# Patient Record
Sex: Female | Born: 1985 | Race: Black or African American | Hispanic: No | Marital: Single | Smoking: Never smoker
Health system: Southern US, Community
[De-identification: ages and names within clinical notes are randomized; demographics above are authoritative.]

## PROBLEM LIST (undated history)

## (undated) DIAGNOSIS — F419 Anxiety disorder, unspecified: Secondary | ICD-10-CM

## (undated) DIAGNOSIS — K8020 Calculus of gallbladder without cholecystitis without obstruction: Secondary | ICD-10-CM

## (undated) DIAGNOSIS — IMO0002 Reserved for concepts with insufficient information to code with codable children: Secondary | ICD-10-CM

## (undated) DIAGNOSIS — R0600 Dyspnea, unspecified: Secondary | ICD-10-CM

## (undated) DIAGNOSIS — J069 Acute upper respiratory infection, unspecified: Secondary | ICD-10-CM

## (undated) DIAGNOSIS — K219 Gastro-esophageal reflux disease without esophagitis: Secondary | ICD-10-CM

## (undated) HISTORY — DX: Reserved for concepts with insufficient information to code with codable children: IMO0002

## (undated) HISTORY — DX: Gastro-esophageal reflux disease without esophagitis: K21.9

## (undated) HISTORY — DX: Calculus of gallbladder without cholecystitis without obstruction: K80.20

## (undated) HISTORY — PX: CHOLECYSTECTOMY: SHX55

---
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
## 2004-02-12 ENCOUNTER — Other Ambulatory Visit: Admission: RE | Admit: 2004-02-12 | Discharge: 2004-02-12 | Payer: Self-pay | Admitting: Family Medicine

## 2005-09-20 ENCOUNTER — Emergency Department (HOSPITAL_COMMUNITY): Admission: EM | Admit: 2005-09-20 | Discharge: 2005-09-20 | Payer: Self-pay | Admitting: Emergency Medicine

## 2006-02-16 ENCOUNTER — Inpatient Hospital Stay (HOSPITAL_COMMUNITY): Admission: AD | Admit: 2006-02-16 | Discharge: 2006-02-16 | Payer: Self-pay | Admitting: *Deleted

## 2006-03-06 ENCOUNTER — Inpatient Hospital Stay (HOSPITAL_COMMUNITY): Admission: AD | Admit: 2006-03-06 | Discharge: 2006-03-06 | Payer: Self-pay | Admitting: Family Medicine

## 2006-06-17 ENCOUNTER — Inpatient Hospital Stay (HOSPITAL_COMMUNITY): Admission: AD | Admit: 2006-06-17 | Discharge: 2006-06-17 | Payer: Self-pay | Admitting: Family Medicine

## 2006-08-11 ENCOUNTER — Ambulatory Visit: Payer: Self-pay | Admitting: Obstetrics & Gynecology

## 2006-08-11 ENCOUNTER — Encounter (INDEPENDENT_AMBULATORY_CARE_PROVIDER_SITE_OTHER): Payer: Self-pay | Admitting: Obstetrics & Gynecology

## 2006-12-21 ENCOUNTER — Inpatient Hospital Stay (HOSPITAL_COMMUNITY): Admission: AD | Admit: 2006-12-21 | Discharge: 2006-12-21 | Payer: Self-pay | Admitting: Obstetrics and Gynecology

## 2007-06-02 ENCOUNTER — Inpatient Hospital Stay (HOSPITAL_COMMUNITY): Admission: AD | Admit: 2007-06-02 | Discharge: 2007-06-03 | Payer: Self-pay | Admitting: Gynecology

## 2007-09-29 ENCOUNTER — Inpatient Hospital Stay (HOSPITAL_COMMUNITY): Admission: AD | Admit: 2007-09-29 | Discharge: 2007-09-30 | Payer: Self-pay | Admitting: Obstetrics & Gynecology

## 2008-01-19 ENCOUNTER — Inpatient Hospital Stay (HOSPITAL_COMMUNITY): Admission: AD | Admit: 2008-01-19 | Discharge: 2008-01-21 | Payer: Self-pay | Admitting: Obstetrics and Gynecology

## 2008-10-26 HISTORY — PX: LAPAROSCOPIC CHOLECYSTECTOMY: SUR755

## 2010-10-14 ENCOUNTER — Emergency Department (HOSPITAL_COMMUNITY)
Admission: EM | Admit: 2010-10-14 | Discharge: 2010-10-14 | Payer: Self-pay | Source: Home / Self Care | Admitting: Emergency Medicine

## 2011-01-05 LAB — POCT I-STAT, CHEM 8
BUN: 7 mg/dL (ref 6–23)
Calcium, Ion: 1.02 mmol/L — ABNORMAL LOW (ref 1.12–1.32)
Chloride: 106 mEq/L (ref 96–112)
Creatinine, Ser: 1 mg/dL (ref 0.4–1.2)
Glucose, Bld: 112 mg/dL — ABNORMAL HIGH (ref 70–99)
HCT: 40 % (ref 36.0–46.0)
Hemoglobin: 13.6 g/dL (ref 12.0–15.0)
Potassium: 4.5 mEq/L (ref 3.5–5.1)
Sodium: 140 mEq/L (ref 135–145)
TCO2: 28 mmol/L (ref 0–100)

## 2011-01-05 LAB — URINALYSIS, ROUTINE W REFLEX MICROSCOPIC
Bilirubin Urine: NEGATIVE
Glucose, UA: NEGATIVE mg/dL
Hgb urine dipstick: NEGATIVE
Ketones, ur: NEGATIVE mg/dL
Nitrite: NEGATIVE
Protein, ur: NEGATIVE mg/dL
Specific Gravity, Urine: 1.018 (ref 1.005–1.030)
Urobilinogen, UA: 1 mg/dL (ref 0.0–1.0)
pH: 7.5 (ref 5.0–8.0)

## 2011-01-05 LAB — DIFFERENTIAL
Basophils Absolute: 0 10*3/uL (ref 0.0–0.1)
Basophils Relative: 0 % (ref 0–1)
Eosinophils Absolute: 0.1 10*3/uL (ref 0.0–0.7)
Eosinophils Relative: 1 % (ref 0–5)
Lymphocytes Relative: 11 % — ABNORMAL LOW (ref 12–46)
Lymphs Abs: 1.3 10*3/uL (ref 0.7–4.0)
Monocytes Absolute: 0.9 10*3/uL (ref 0.1–1.0)
Monocytes Relative: 7 % (ref 3–12)
Neutro Abs: 10.2 10*3/uL — ABNORMAL HIGH (ref 1.7–7.7)
Neutrophils Relative %: 82 % — ABNORMAL HIGH (ref 43–77)

## 2011-01-05 LAB — POCT PREGNANCY, URINE: Preg Test, Ur: NEGATIVE

## 2011-01-05 LAB — CBC
HCT: 37.7 % (ref 36.0–46.0)
Hemoglobin: 13.4 g/dL (ref 12.0–15.0)
MCH: 31.2 pg (ref 26.0–34.0)
MCHC: 35.5 g/dL (ref 30.0–36.0)
MCV: 87.9 fL (ref 78.0–100.0)
Platelets: 299 10*3/uL (ref 150–400)
RBC: 4.29 MIL/uL (ref 3.87–5.11)
RDW: 12 % (ref 11.5–15.5)
WBC: 12.4 10*3/uL — ABNORMAL HIGH (ref 4.0–10.5)

## 2011-01-05 LAB — URINE MICROSCOPIC-ADD ON

## 2011-01-05 LAB — HEMOCCULT GUIAC POC 1CARD (OFFICE): Fecal Occult Bld: NEGATIVE

## 2011-01-05 LAB — OCCULT BLOOD, POC DEVICE: Fecal Occult Bld: NEGATIVE

## 2011-03-10 NOTE — Discharge Summary (Signed)
NAMEMarland Cruz  DARRA, [name]:  [id number]   MEDICAL RECORD NO.:  [id number]          PATIENT TYPE:  INP   LOCATION:  9142                          [facility]ILITY:  [facility]   PHYSICIAN:  [name], M.D. DATE OF BIRTH:  1986-09-30   DATE OF ADMISSION:  01/19/2008  DATE OF DISCHARGE:  01/21/2008                               DISCHARGE SUMMARY   HISTORY OF PRESENT ILLNESS:  A 25 year old black female para 0-0-1-0,  gravida 2 at [redacted] weeks gestation with an [facility] of January 25, 2008, by 6-week  ultrasound, presented to the office with spontaneous rupture of  membranes at approximately 9 a.m.  Clear fluid was noted and confirmed  at the office.  Prenatal care was started when the patient was 32 weeks'  gestation, complicated by ongoing nausea and vomiting related to  cholelithiasis at times.   PRENATAL CARE AND LABS:  O positive, negative antibody, sickle cell AS,  rubella immune, hepatitis B surface antigen negative, HIV negative, GC  and chlamydia negative, Group B Strep negative, and 1-hour Glucola 82.  The patient was too late for genetic screens.   PAST OBSTETRIC HISTORY:  Early abortion x1.  Negative GYN and surgical  history.  The patient does have sickle cell trait.   ALLERGIES:  She has no allergies.   MEDICATIONS:  Include Phenergan, hydrocodone, and Prilosec.   PHYSICAL EXAMINATION:  She is afebrile.  Vitals signs were normal.  Heart and lungs were normal.  Abdomen was soft, contraction to every 3-5  minutes.  Cervix was 1-2 cm, 50%.  Vertex was confirmed by sonogram.   HOSPITAL COURSE:  The patient was placed on Pitocin.  By 6 p.m, she was  comfortable with an epidural and the cervix was 5 cm, 80%, vertex at -1  to 0.  Intrauterine pressure catheter was placed to adjust the Pitocin.  The patient reached complete dilatation and pushed well for  approximately 1 hour with a spontaneous vaginal delivery of a vigorous  female infant by Dr. Senaida Ores over an intact  perineum.  Weight was 7  pounds 1 ounce.  Apgars 8 and 9 at 1 and 5 minutes.  Nuchal cord x1 was  reduced over the head.  Placenta delivered spontaneously.  Cervix,  rectum, and vagina were intact.  Blood loss about 350 mL.  Postpartum,  the patient did well and was discharged on the second postpartum day.  She was seen in consultation by social services because of late prenatal  care.  No further intervention was required.   LABORATORY DATA:  Showed an initial hemoglobin of 11.2, hematocrit 32.6,  white count of 11,200, and platelet count of 270,000.  RPR was  nonreactive.  Followup hemoglobin 7.3.   FINAL DIAGNOSES:  1. Intrauterine pregnancy at 39 plus weeks, delivered vertex.  2. Cholelithiasis.   OPERATION:  Spontaneous delivery vertex.   CONDITION ON DISCHARGE:  Improved.   DISCHARGE INSTRUCTIONS:  Instructions include our regular discharge  instruction booklet.  The patient has hydrocodone at home.  She was  given a prescription for Motrin 600 mg 30 tables 1 q.6 h. as  needed for  pain with 1 refill.  She is to return to the office in 6 weeks for  followup examination.   The patient is to be instructed at 6 weeks about a surgical  consultation.      [name], M.D.  Electronically Signed     TFH/MEDQ  D:  01/21/2008  T:  01/21/2008  Job:  [id number]

## 2011-03-13 NOTE — Group Therapy Note (Signed)
NAME:  Sabrina Cruz, Sabrina Cruz NO.:  [id number]   MEDICAL RECORD NO.:  [id number]          PATIENT TYPE:  WOC   LOCATION:  [facility]                   FACILITY:  [facility]   PHYSICIAN:  Dorthula Perfect, MD     DATE OF BIRTH:  1986-07-11   DATE OF SERVICE:                                    CLINIC NOTE   A 25 year old black female gravida 1, abort 1, last menstrual period July 29, 2006 was seen in the MAU for abnormal uterine bleeding, clotting, etc. on  June 17, 2006.  Transvaginal ultrasound was done at that time.  The right  ovary measures 6.5 x 5.5 x 6.8.  It contained a simple cyst that measured  4.8 x 5.6 x 6.2.  Evaluation in the MAU was pretty much normal.  She was  treated with Femcon FE.   She currently takes Femcon FE and now has cyclic periods and no abnormal  bleeding.  In addition, when she was seen in the MAU, she was having some  right lower quadrant discomfort which has now gone away.  Her last Pap smear  was a year ago and by her description was normal.   REVIEW OF SYSTEMS:  Normal good health.  No GI or GU complaints.   PHYSICAL EXAMINATION:  Weight 180, 5 foot 6, blood pressure 115/71.  The  abdomen is slightly obese.  It is soft and nontender.  PELVIC EXAMINATION:  External genitalia, BUS and glands were normal.  Vaginal vault epithelialized.  The cervix was nulliparous and normal.  Uterus is in the midline and is of normal size and shape.  Both adnexal  areas are normal.  I cannot palpate an ovarian cyst on the right side today.   IMPRESSION:  Ovarian cyst resolved.   DISPOSITION:  1. Pap smear.  2. The patient with will continue with oral contraception, Femcon FE.  3. She is advised that if her Pap smear is normal it needs to be repeated      in a year.           ______________________________  Dorthula Perfect, MD     ER/MEDQ  D:  08/11/2006  T:  08/12/2006  Job:  [id number]

## 2011-07-20 LAB — CBC
HCT: 30.4 — ABNORMAL LOW
HCT: 32.6 — ABNORMAL LOW
Hemoglobin: 10.3 — ABNORMAL LOW
Hemoglobin: 11.2 — ABNORMAL LOW
MCHC: 34
MCHC: 34.4
MCV: 89.5
MCV: 90.1
Platelets: 249
Platelets: 270
RBC: 3.37 — ABNORMAL LOW
RBC: 3.65 — ABNORMAL LOW
RDW: 13.4
RDW: 13.6
WBC: 11.2 — ABNORMAL HIGH
WBC: 19.6 — ABNORMAL HIGH

## 2011-07-20 LAB — RPR: RPR Ser Ql: NONREACTIVE

## 2011-07-20 LAB — CCBB MATERNAL DONOR DRAW

## 2011-08-03 LAB — URINALYSIS, ROUTINE W REFLEX MICROSCOPIC
Bilirubin Urine: NEGATIVE
Glucose, UA: NEGATIVE
Hgb urine dipstick: NEGATIVE
Ketones, ur: NEGATIVE
Nitrite: NEGATIVE
Protein, ur: NEGATIVE
Specific Gravity, Urine: 1.01
Urobilinogen, UA: 0.2
pH: 7

## 2011-08-05 ENCOUNTER — Emergency Department (HOSPITAL_COMMUNITY)
Admission: EM | Admit: 2011-08-05 | Discharge: 2011-08-05 | Disposition: A | Discharge status: Home / Self Care | Payer: Self-pay | Attending: Emergency Medicine | Admitting: Emergency Medicine

## 2011-08-05 ENCOUNTER — Emergency Department (HOSPITAL_COMMUNITY): Payer: Self-pay

## 2011-08-05 DIAGNOSIS — R509 Fever, unspecified: Secondary | ICD-10-CM | POA: Insufficient documentation

## 2011-08-05 DIAGNOSIS — R51 Headache: Secondary | ICD-10-CM | POA: Insufficient documentation

## 2011-08-05 LAB — CBC
HCT: 37.7 % (ref 36.0–46.0)
Hemoglobin: 13.1 g/dL (ref 12.0–15.0)
MCH: 30.3 pg (ref 26.0–34.0)
MCHC: 34.7 g/dL (ref 30.0–36.0)
MCV: 87.3 fL (ref 78.0–100.0)
Platelets: 209 10*3/uL (ref 150–400)
RBC: 4.32 MIL/uL (ref 3.87–5.11)
RDW: 11.8 % (ref 11.5–15.5)
WBC: 4 10*3/uL (ref 4.0–10.5)

## 2011-08-05 LAB — BASIC METABOLIC PANEL
BUN: 7 mg/dL (ref 6–23)
CO2: 26 mEq/L (ref 19–32)
Calcium: 9.4 mg/dL (ref 8.4–10.5)
Chloride: 100 mEq/L (ref 96–112)
Creatinine, Ser: 0.79 mg/dL (ref 0.50–1.10)
GFR calc Af Amer: >90 mL/min (ref >90)
GFR calc non Af Amer: >90 mL/min (ref >90)
Glucose, Bld: 102 mg/dL — ABNORMAL HIGH (ref 70–99)
Potassium: 3.8 mEq/L (ref 3.5–5.1)
Sodium: 134 mEq/L — ABNORMAL LOW (ref 135–145)

## 2011-08-05 LAB — DIFFERENTIAL
Basophils Absolute: 0 10*3/uL (ref 0.0–0.1)
Basophils Relative: 1 % (ref 0–1)
Eosinophils Absolute: 0.1 10*3/uL (ref 0.0–0.7)
Eosinophils Relative: 2 % (ref 0–5)
Lymphocytes Relative: 20 % (ref 12–46)
Lymphs Abs: 0.8 10*3/uL (ref 0.7–4.0)
Monocytes Absolute: 0.5 10*3/uL (ref 0.1–1.0)
Monocytes Relative: 13 % — ABNORMAL HIGH (ref 3–12)
Neutro Abs: 2.7 10*3/uL (ref 1.7–7.7)
Neutrophils Relative %: 66 % (ref 43–77)

## 2011-08-10 LAB — URINALYSIS, ROUTINE W REFLEX MICROSCOPIC
Bilirubin Urine: NEGATIVE
Glucose, UA: NEGATIVE
Hgb urine dipstick: NEGATIVE
Ketones, ur: NEGATIVE
Nitrite: NEGATIVE
Protein, ur: NEGATIVE
Specific Gravity, Urine: 1.015
Urobilinogen, UA: 0.2
pH: 6

## 2011-08-10 LAB — GC/CHLAMYDIA PROBE AMP, GENITAL
Chlamydia, DNA Probe: NEGATIVE
GC Probe Amp, Genital: NEGATIVE

## 2011-08-10 LAB — URINE MICROSCOPIC-ADD ON

## 2011-08-10 LAB — URINE CULTURE: Colony Count: 15000

## 2011-08-10 LAB — BASIC METABOLIC PANEL
BUN: 5 — ABNORMAL LOW
CO2: 26
Calcium: 9.2
Chloride: 104
Creatinine, Ser: 0.74
GFR calc Af Amer: >60
GFR calc non Af Amer: >60
Glucose, Bld: 98
Potassium: 3.5
Sodium: 137

## 2011-08-10 LAB — CBC
HCT: 37.2
Hemoglobin: 12.4
MCHC: 33.5
MCV: 91.5
Platelets: 334
RBC: 4.06
RDW: 12.1
WBC: 10.2

## 2011-08-10 LAB — RPR: RPR Ser Ql: NONREACTIVE

## 2011-08-10 LAB — WET PREP, GENITAL
Trich, Wet Prep: NONE SEEN
Yeast Wet Prep HPF POC: NONE SEEN

## 2011-08-10 LAB — HCG, QUANTITATIVE, PREGNANCY: hCG, Beta Chain, Quant, S: 47500 — ABNORMAL HIGH

## 2011-08-10 LAB — PREGNANCY, URINE: Preg Test, Ur: POSITIVE

## 2011-12-08 ENCOUNTER — Emergency Department (HOSPITAL_COMMUNITY)
Admission: EM | Admit: 2011-12-08 | Discharge: 2011-12-09 | Discharge status: Against Medical Advice | Payer: Self-pay | Attending: Emergency Medicine | Admitting: Emergency Medicine

## 2011-12-08 ENCOUNTER — Encounter (HOSPITAL_COMMUNITY): Payer: Self-pay | Admitting: *Deleted

## 2011-12-08 DIAGNOSIS — R109 Unspecified abdominal pain: Secondary | ICD-10-CM | POA: Insufficient documentation

## 2011-12-08 LAB — URINALYSIS, ROUTINE W REFLEX MICROSCOPIC
Bilirubin Urine: NEGATIVE
Glucose, UA: NEGATIVE mg/dL
Hgb urine dipstick: NEGATIVE
Ketones, ur: NEGATIVE mg/dL
Leukocytes, UA: NEGATIVE
Nitrite: NEGATIVE
Protein, ur: NEGATIVE mg/dL
Specific Gravity, Urine: 1.013 (ref 1.005–1.030)
Urobilinogen, UA: 1 mg/dL (ref 0.0–1.0)
pH: 6 (ref 5.0–8.0)

## 2011-12-08 NOTE — ED Notes (Signed)
Called 3x no response 

## 2011-12-08 NOTE — ED Notes (Signed)
Called x 1 with no response.

## 2011-12-08 NOTE — ED Notes (Signed)
Pt is here for evaluation of upper abdominal that began yesterday, she states that it feels like a pressure.  This has been associated with nausea, no vomiting, no diarrhea, no GU symptoms.

## 2012-08-25 ENCOUNTER — Encounter (HOSPITAL_COMMUNITY): Payer: Self-pay | Admitting: Emergency Medicine

## 2012-08-25 ENCOUNTER — Emergency Department (INDEPENDENT_AMBULATORY_CARE_PROVIDER_SITE_OTHER)
Admission: EM | Admit: 2012-08-25 | Discharge: 2012-08-25 | Disposition: A | Discharge status: Home / Self Care | Payer: Self-pay | Source: Home / Self Care

## 2012-08-25 DIAGNOSIS — L259 Unspecified contact dermatitis, unspecified cause: Secondary | ICD-10-CM

## 2012-08-25 DIAGNOSIS — L239 Allergic contact dermatitis, unspecified cause: Secondary | ICD-10-CM

## 2012-08-25 MED ORDER — PREDNISONE 10 MG PO TABS: ORAL_TABLET | ORAL | Status: DC

## 2012-08-25 NOTE — ED Notes (Signed)
Rash noticed around 8:30 p.m. Last night, since then has spread. Reports the rash itches.  Patient has had a "cold" for 3 days.  Nasal congestion, scratchy throat.  No fever, reports cold is much better.

## 2012-08-25 NOTE — ED Provider Notes (Signed)
History     CSN: [id number]  Arrival date & time 08/25/12  0841   None     Chief Complaint  Patient presents with  . Rash    (Consider location/radiation/quality/duration/timing/severity/associated sxs/prior treatment) Patient is a 26 y.o. female presenting with rash. The history is provided by the patient. No language interpreter was used.  Rash  This is a new problem. The current episode started yesterday. The problem is associated with nothing. There has been no fever. The pain is moderate. The pain has been constant since onset. She has tried nothing for the symptoms. The treatment provided moderate relief.  Pt complains of a rash and itching. No new exposure   History reviewed. No pertinent past medical history.  Past Surgical History  Procedure Date  . Cholecystectomy     No family history on file.  History  Substance Use Topics  . Smoking status: Never Smoker   . Smokeless tobacco: Not on file  . Alcohol Use: Yes    OB History    Grav Para Term Preterm Abortions TAB SAB Ect Mult Living                  Review of Systems  Skin: Positive for rash.  All other systems reviewed and are negative.    Allergies  Review of patient's allergies indicates no known allergies.  Home Medications   Current Outpatient Rx  Name Route Sig Dispense Refill  . DIPHENHYDRAMINE HCL 25 MG PO TABS Oral Take 25 mg by mouth every 6 (six) hours as needed.    Marland Kitchen HYDROCORTISONE 1 % EX CREA Topical Apply topically 2 (two) times daily.    . OMEGA-3 FATTY ACIDS 1000 MG PO CAPS Oral Take 1 g by mouth daily.    . ADULT MULTIVITAMIN W/MINERALS CH Oral Take 1 tablet by mouth daily.    Marland Kitchen NAPROXEN SODIUM 220 MG PO TABS Oral Take 440 mg by mouth 2 (two) times daily with a meal.    . VITAMIN C 250 MG PO TABS Oral Take 250 mg by mouth daily.      BP 110/78  Pulse 85  Temp 98.2 F (36.8 C) (Oral)  Resp 18  SpO2 99%  LMP 08/11/2012  Physical Exam  Nursing note and vitals  reviewed. Constitutional: She appears well-developed and well-nourished.  HENT:  Head: Normocephalic and atraumatic.  Eyes: Pupils are equal, round, and reactive to light.  Neck: Normal range of motion.  Pulmonary/Chest: Effort normal.  Abdominal: Soft.  Musculoskeletal: Normal range of motion.  Neurological: She is alert.  Skin: Rash noted. There is erythema.  Psychiatric: She has a normal mood and affect.    ED Course  Procedures (including critical care time)  Labs Reviewed - No data to display No results found.   No diagnosis found.    MDM  Pt advised benadryl and given rx for predsisone.          Lonia Skinner [place], [place] 08/25/12 1022

## 2012-08-26 NOTE — ED Provider Notes (Signed)
Medical screening examination/treatment/procedure(s) were performed by non-physician practitioner and as supervising physician I was immediately available for consultation/collaboration.  Leslee Home, M.D.   Reuben Likes, MD 08/26/12 [phone]

## 2012-11-28 IMAGING — CT CT HEAD W/O CM
2 series · 16 of 30 positions shown, 20 images · non-contrast
Comparison: None.

CLINICAL DATA: Headache.

CT HEAD WITHOUT CONTRAST
TECHNIQUE: Contiguous axial images were obtained from the base of
the skull through the vertex without contrast.

[Series 2: head w/o · axial · non-contrast · 0.43mm/px · z∈[+896,+1016]mm · 13 of 30 slices shown, 17 images]
[im 3/30  brain]
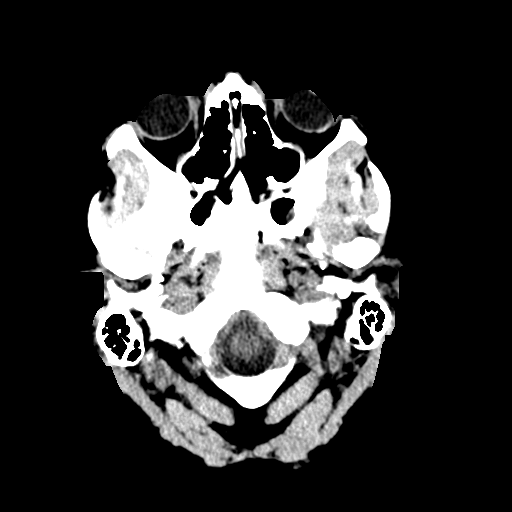
[im 3/30  bone]
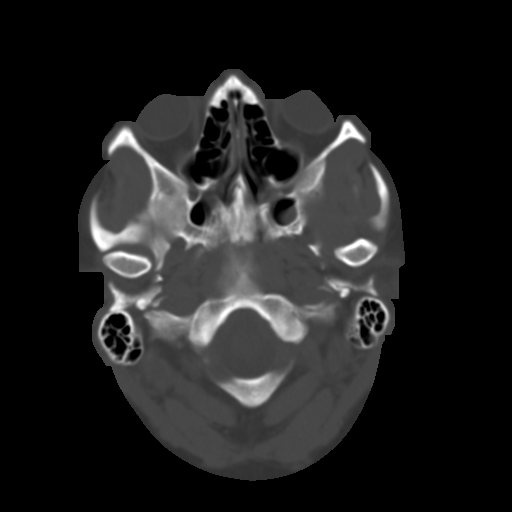
[im 5/30  brain]
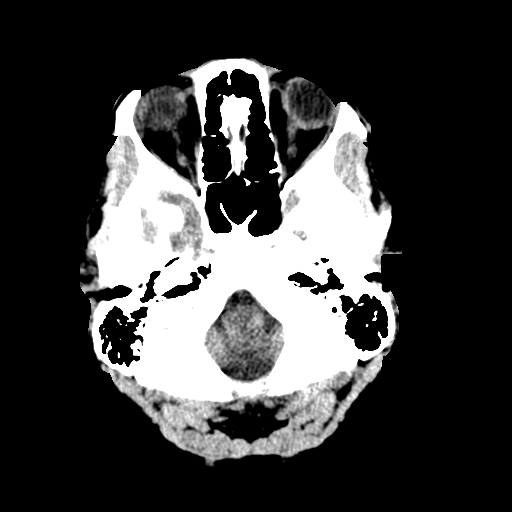
[im 7/30  brain]
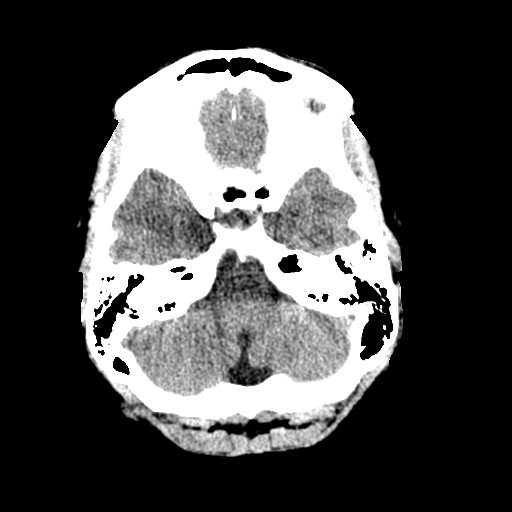
[im 9/30  brain]
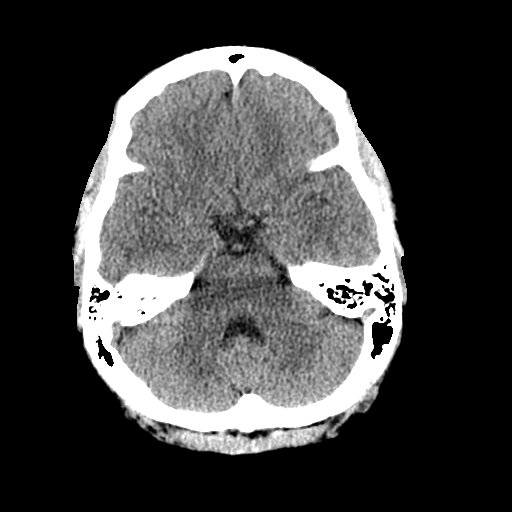
[im 11/30  brain]
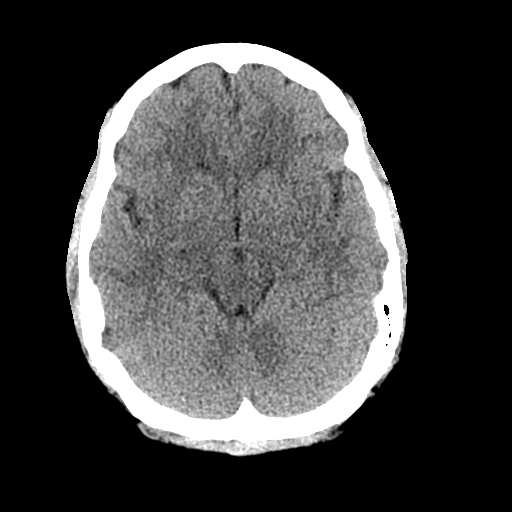
[im 11/30  bone]
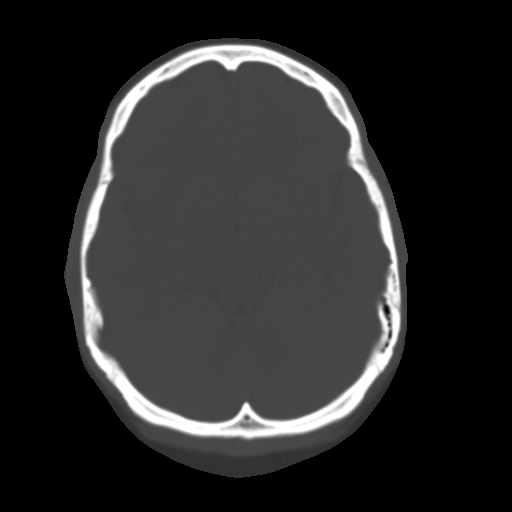
[im 13/30  brain]
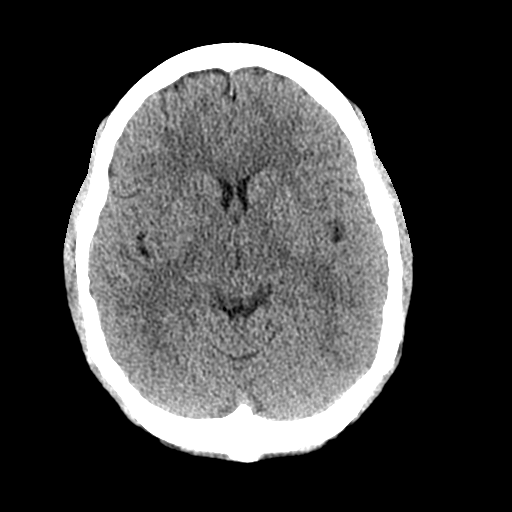
[im 15/30  brain]
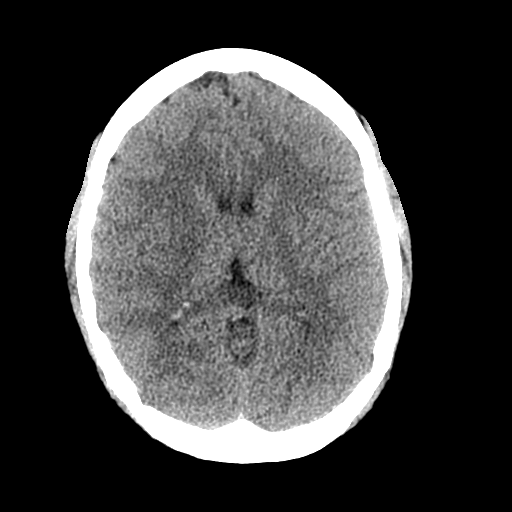
[im 17/30  brain]
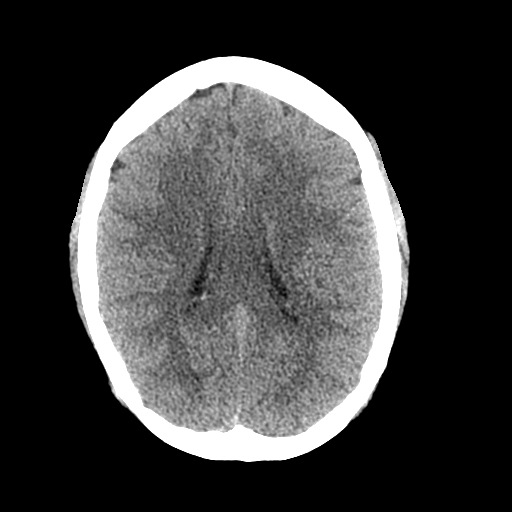
[im 19/30  brain]
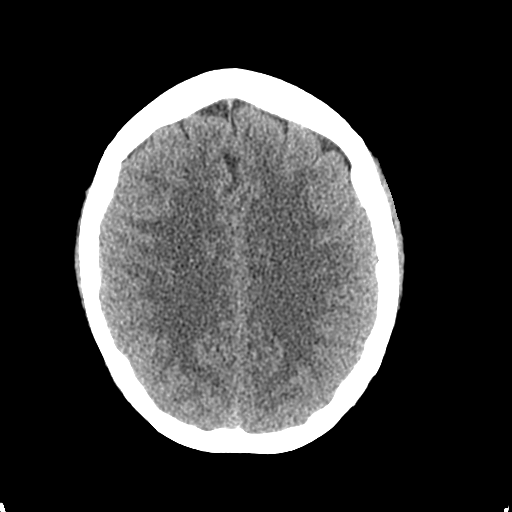
[im 19/30  bone]
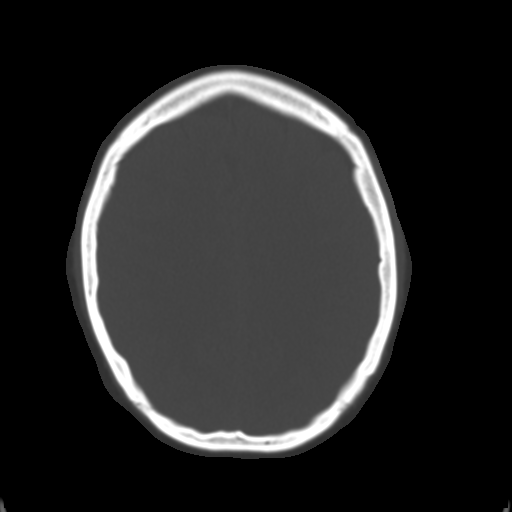
[im 21/30  brain]
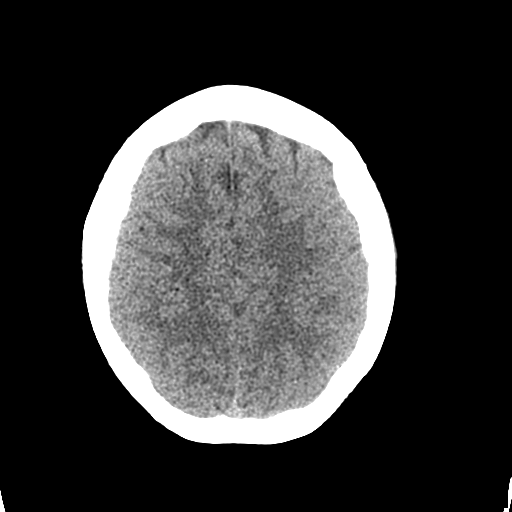
[im 23/30  brain]
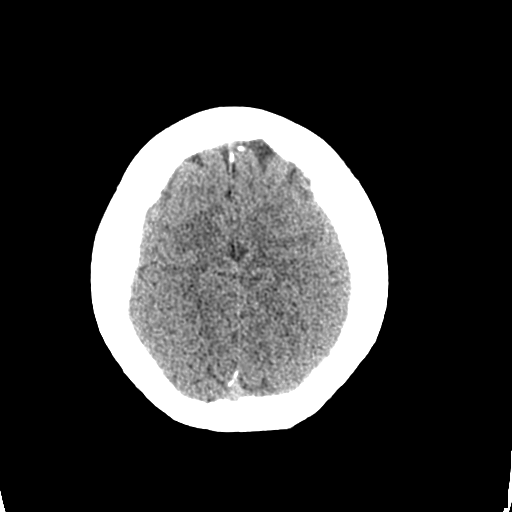
[im 25/30  brain]
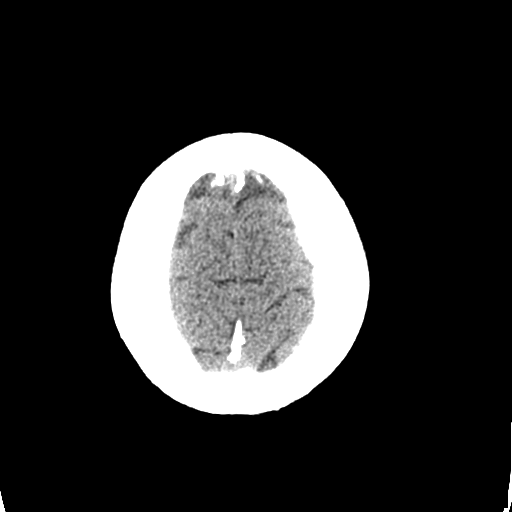
[im 27/30  brain]
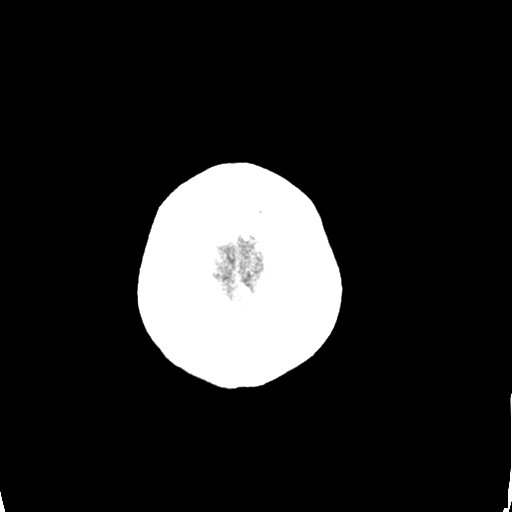
[im 27/30  bone]
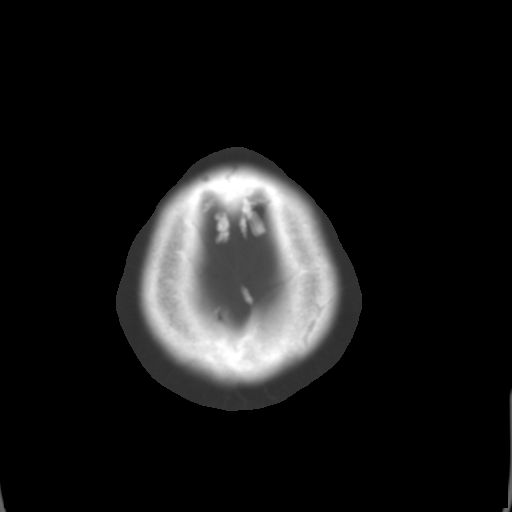

[Series 3: bone windows · axial · 0.43mm/px · z∈[+896,+936]mm · 3 of 30 slices shown]
[im 3/30  bone]
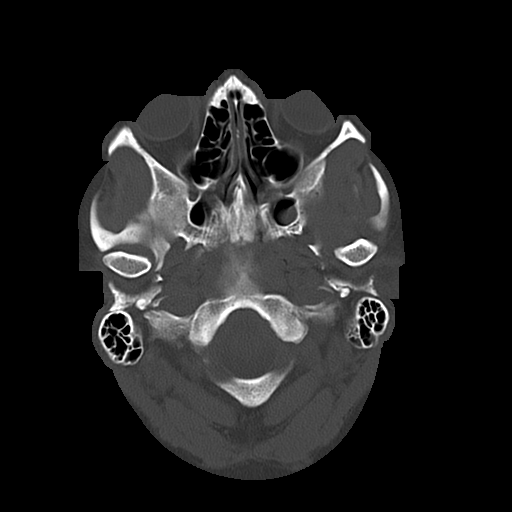
[im 7/30  bone]
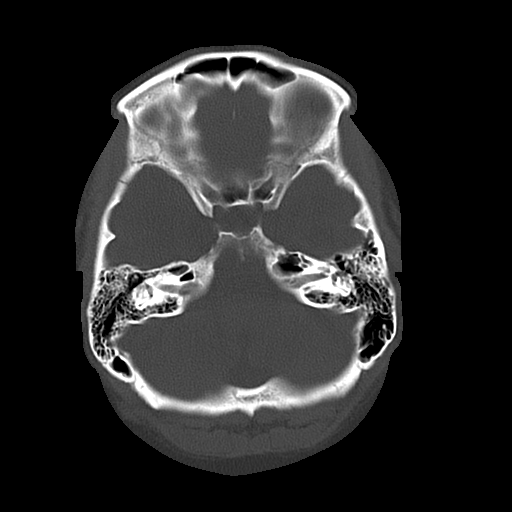
[im 11/30  bone]
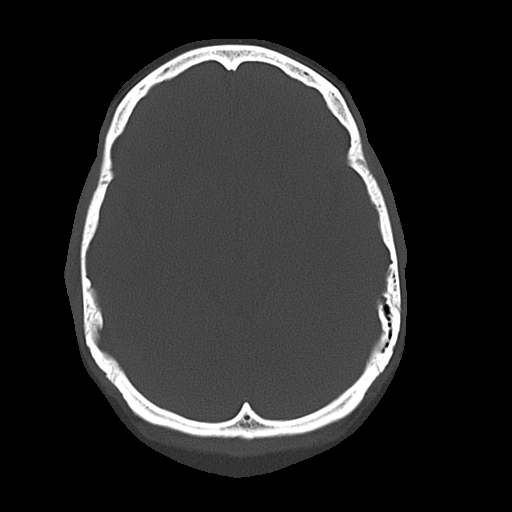

[16 of 30 positions shown; findings below may reference images not displayed]

FINDINGS: No acute intracranial abnormality.  Specifically, no
hemorrhage, hydrocephalus, mass lesion, acute infarction, or
significant intracranial injury.  No acute calvarial abnormality.
Visualized paranasal sinuses and mastoids clear.  Orbital soft
tissues unremarkable.
IMPRESSION: Normal study.

## 2013-01-11 ENCOUNTER — Encounter: Payer: Self-pay | Admitting: Gastroenterology

## 2013-01-19 ENCOUNTER — Encounter: Payer: Self-pay | Admitting: *Deleted

## 2013-01-26 ENCOUNTER — Encounter: Payer: Self-pay | Admitting: Gastroenterology

## 2013-01-26 ENCOUNTER — Ambulatory Visit (INDEPENDENT_AMBULATORY_CARE_PROVIDER_SITE_OTHER): Payer: BC Managed Care – PPO | Admitting: Gastroenterology

## 2013-01-26 VITALS — BP 118/72 | HR 90 | Ht 66.0 in | Wt 243.0 lb

## 2013-01-26 DIAGNOSIS — K625 Hemorrhage of anus and rectum: Secondary | ICD-10-CM

## 2013-01-26 DIAGNOSIS — K9089 Other intestinal malabsorption: Secondary | ICD-10-CM

## 2013-01-26 DIAGNOSIS — Z9049 Acquired absence of other specified parts of digestive tract: Secondary | ICD-10-CM

## 2013-01-26 DIAGNOSIS — K219 Gastro-esophageal reflux disease without esophagitis: Secondary | ICD-10-CM

## 2013-01-26 DIAGNOSIS — E669 Obesity, unspecified: Secondary | ICD-10-CM

## 2013-01-26 DIAGNOSIS — Z9889 Other specified postprocedural states: Secondary | ICD-10-CM

## 2013-01-26 MED ORDER — CHOLESTYRAMINE 4 G PO PACK: 1.0000 | PACK | Freq: Two times a day (BID) | ORAL | Status: DC

## 2013-01-26 MED ORDER — MOVIPREP 100 G PO SOLR: 1.0000 | Freq: Once | ORAL | Status: DC

## 2013-01-26 NOTE — Progress Notes (Addendum)
History of Present Illness:  This is a very pleasant 27 year old African American female who is several years status post cholecystectomy and endoscopic sphincterotomy.  She's had diarrhea described as urgent normal colored stools ever since her surgery, and has recently had some bright red blood per rectum.  She denies abdominal pain, but does have dyspepsia and reflux symptoms.  She was recently treated for H. pylori by Dr. Viann Shove this did not alleviate any of her upper GI dyspeptic and reflux symptoms.  She's not had previous colonoscopy.  There is no nocturnal diarrhea, anorexia, weight loss, but the patient may have lactose intolerance.  She denies fever, chills, skin rashes, joint pains, oral stomatitis.  Medical history is otherwise noncontributory.  I have reviewed this patient's present history, medical and surgical past history, allergies and medications.     ROS:   All systems were reviewed and are negative unless otherwise stated in the HPI.    Physical Exam: Blood pressure 118/72, pulse 90, and weight 243 with a BMI of 39.24. General well developed well nourished patient in no acute distress, appearing their stated age Eyes PERRLA, no icterus, fundoscopic exam per opthamologist Skin no lesions noted Neck supple, no adenopathy, no thyroid enlargement, no tenderness Chest clear to percussion and auscultation Heart no significant murmurs, gallops or rubs noted Abdomen no hepatosplenomegaly masses or tenderness, BS normal. Extremities no acute joint lesions, edema, phlebitis or evidence of cellulitis. Neurologic patient oriented x 3, cranial nerves intact, no focal neurologic deficits noted. Psychological mental status normal and normal affect.  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  Because of her rectal bleeding, we will proceed with colonoscopy to exclude inflammatory bowel disease or colonic polyposis.  I have placed her on  Questran 4 g with juice in midmorning, twice a day if needed.  Because of her continued dyspepsia and reflux symptoms, we will also do endoscopy check for H. pylori eradication and obtain small bowel biopsy.  Review of her labs shows a normal CBC, metabolic profile, liver profile.  She may need hydrogen breath testing for lactose intolerance.  Please copy this note to Dr. Luretha Murphy at central [facility] surgery and did Dr. Viann Shove in primary care  Encounter Diagnoses  Name Primary?  . Rectal bleeding Yes  . Diarrhea   . Abdominal pain, unspecified site

## 2013-01-26 NOTE — Addendum Note (Signed)
Addended by: Ok Anis A on: 01/26/2013 10:09 AM   Modules accepted: Orders

## 2013-01-26 NOTE — Patient Instructions (Signed)
  You have been scheduled for a colonoscopy with propofol 01-27-2013. Please follow written instructions given to you at your visit today.  Please pick up your prep kit at the pharmacy within the next 1-3 days. If you use inhalers (even only as needed), please bring them with you on the day of your procedure.  Questran was sent to your pharmacy, please take as directed. __________________________________________________________________________________                                               We are excited to introduce MyChart, a new best-in-class service that provides you online access to important information in your electronic medical record. We want to make it easier for you to view your health information - all in one secure location - when and where you need it. We expect MyChart will enhance the quality of care and service we provide.  When you register for MyChart, you can:    View your test results.    Request appointments and receive appointment reminders via email.    Request medication renewals.    View your medical history, allergies, medications and immunizations.    Communicate with your physician's office through a password-protected site.    Conveniently print information such as your medication lists.  To find out if MyChart is right for you, please talk to a member of our clinical staff today. We will gladly answer your questions about this free health and wellness tool.  If you are age 3 or older and want a member of your family to have access to your record, you must provide written consent by completing a proxy form available at our office. Please speak to our clinical staff about guidelines regarding accounts for patients younger than age 43.  As you activate your MyChart account and need any technical assistance, please call the MyChart technical support line at [phone]-[phone]) or email your question to [email or web address]. If you email  your question(s), please include your name, a return phone number and the best time to reach you.  If you have non-urgent health-related questions, you can send a message to our office through MyChart at [email or web address]. If you have a medical emergency, call 911.  Thank you for using MyChart as your new health and wellness resource!   MyChart licensed from [employer],  4332-9518. Patents Pending.

## 2013-01-27 ENCOUNTER — Ambulatory Visit (AMBULATORY_SURGERY_CENTER): Payer: BC Managed Care – PPO | Admitting: Gastroenterology

## 2013-01-27 ENCOUNTER — Encounter: Payer: Self-pay | Admitting: Gastroenterology

## 2013-01-27 VITALS — BP 115/82 | HR 85 | Temp 97.7°F | Resp 16 | Ht 66.0 in | Wt 243.0 lb

## 2013-01-27 DIAGNOSIS — D126 Benign neoplasm of colon, unspecified: Secondary | ICD-10-CM

## 2013-01-27 DIAGNOSIS — K625 Hemorrhage of anus and rectum: Secondary | ICD-10-CM

## 2013-01-27 DIAGNOSIS — K219 Gastro-esophageal reflux disease without esophagitis: Secondary | ICD-10-CM

## 2013-01-27 DIAGNOSIS — R197 Diarrhea, unspecified: Secondary | ICD-10-CM

## 2013-01-27 DIAGNOSIS — K644 Residual hemorrhoidal skin tags: Secondary | ICD-10-CM

## 2013-01-27 DIAGNOSIS — D1330 Benign neoplasm of unspecified part of small intestine: Secondary | ICD-10-CM

## 2013-01-27 MED ORDER — SODIUM CHLORIDE 0.9 % IV SOLN: 500.0000 mL | INTRAVENOUS | Status: DC

## 2013-01-27 MED ORDER — OMEPRAZOLE 20 MG PO CPDR: 20.0000 mg | DELAYED_RELEASE_CAPSULE | Freq: Every day | ORAL | Status: DC

## 2013-01-27 NOTE — Progress Notes (Addendum)
1506- pt very tearful and crying.  States her throat is hurting, rating as "5."  She states she cannot breathe, but sats 99-100%.  Mother at bedside and is helping pt relax  1540- no pain at discharge.  Able to drink water without difficulty  Patient did not have preoperative order for IV antibiotic SSI prophylaxis. (G8918)Patient did not experience any of the following events: a burn prior to discharge; a fall within the facility; wrong site/side/patient/procedure/implant event; or a hospital transfer or hospital admission upon discharge from the facility. [phone])

## 2013-01-27 NOTE — Progress Notes (Signed)
Called to room to assist during endoscopic procedure.  Patient ID and intended procedure confirmed with present staff. Received instructions for my participation in the procedure from the performing physician.  

## 2013-01-27 NOTE — Op Note (Signed)
[facility] [address] [place], [address]   COLONOSCOPY PROCEDURE REPORT  PATIENT: [name], [name]  MR#: [id number] BIRTHDATE: Mar 17, 1986 , 26  yrs. old GENDER: Female ENDOSCOPIST: Mardella Layman, MD, [facility] Graham REFERRED BY:  Alwyn Pea, M.D. PROCEDURE DATE:  01/27/2013 PROCEDURE:   Colonoscopy with biopsy ASA CLASS:   Class II INDICATIONS:Unexplained diarrhea and Rectal Bleeding. MEDICATIONS: Propofol (Diprivan) 400 mg IV  DESCRIPTION OF PROCEDURE:   After the risks and benefits and of the procedure were explained, informed consent was obtained.  A digital rectal exam revealed external hemorrhoids.    The LB CF-H180AL [id number]  endoscope was introduced through the anus and advanced to the cecum, which was identified by both the appendix and ileocecal valve .  The quality of the prep was excellent, using MoviPrep . The instrument was then slowly withdrawn as the colon was fully examined.     COLON FINDINGS: The mucosa appeared normal in the terminal ileum. A normal appearing cecum, ileocecal valve, and appendiceal orifice were identified.  The ascending, hepatic flexure, transverse, splenic flexure, descending, sigmoid colon and rectum appeared unremarkable.  No polyps or cancers were seen.  Multiple random biopsies of the area were performed.     Retroflexed views revealed no abnormalities.     The scope was then withdrawn from the patient and the procedure completed.  COMPLICATIONS: There were no complications. ENDOSCOPIC IMPRESSION: 1.   Normal mucosa in the terminal ileum and entire colon...r/o microscopic colitis 2.   Normal colon; multiple random biopsies of the area were performed 3. Non bleeding external hemorrhoids RECOMMENDATIONS: 1.  Await pathology results 2.  Continue current medications 3.  Upper endoscopy will be scheduled   REPEAT EXAM:  cc:  _______________________________ eSignedMardella Layman, MD, [facility] 01/27/2013  2:54 PM

## 2013-01-27 NOTE — Progress Notes (Signed)
After pt was ready she said her iv hurt.  I assess it and it was dripping great.  No redness or swelling noted.  I tried to loosen the tape.  I gave there the option of removing the IV or leaving it in.  She said to leave it in, but it did hurt.  I told her if she changed her mind to let me know. Pt was very nervous in the admitting room. Maw

## 2013-01-27 NOTE — Patient Instructions (Addendum)

## 2013-01-27 NOTE — Op Note (Signed)
[facility] [address] [place], [address]   ENDOSCOPY PROCEDURE REPORT  PATIENT: Sabrina Cruz, Sabrina Cruz  MR#: [id number] BIRTHDATE: [birth year] , 26  yrs. old GENDER: Female ENDOSCOPIST:David Hale Bogus, MD, [facility] REFERRED BY: PROCEDURE DATE:  01/27/2013 PROCEDURE:   EGD w/ biopsy and EGD w/ biopsy for H.pylori ASA CLASS:    Class II INDICATIONS: Epigastric pain and Unexplained diarrhea. MEDICATION: There was residual sedation effect present from prior procedure and Propofol (Diprivan) 350 mg IV TOPICAL ANESTHETIC:  DESCRIPTION OF PROCEDURE:   After the risks and benefits of the procedure were explained, informed consent was obtained.  The LB GIF-H180 [id number]  endoscope was introduced through the mouth  and advanced to the second portion of the duodenum .  The instrument was slowly withdrawn as the mucosa was fully examined.      DUODENUM: The duodenal mucosa showed no abnormalities in the 2nd part of the duodenum.  Cold forcep biopsies were taken in the second portion.  STOMACH: The mucosa of the stomach appeared normal.  A  CLO biopsy was performed.    Retroflexed views revealed no abnormalities. The scope was then withdrawn from the patient and the procedure completed.  ESOPHAHUS: see pictures,healing erosions in distal esophagus.  COMPLICATIONS: There were no complications.   ENDOSCOPIC IMPRESSION: 1.   The duodenal mucosa showed no abnormalities in the 2nd part of the duodenum 2.   The mucosa of the stomach appeared normal; CLO biopsy done... 3.   Erosive esophagitis c/w GERD. RECOMMENDATIONS: 1.  Await pathology results 2.  Continue PPI    _______________________________ eSigned:  Mardella Layman, MD, [facility] 01/27/2013 3:05 PM   antireflux   PATIENT NAME:  Kathy, Wahid MR#: [id number]

## 2013-01-30 ENCOUNTER — Telehealth: Payer: Self-pay | Admitting: *Deleted

## 2013-01-30 LAB — HELICOBACTER PYLORI SCREEN-BIOPSY: UREASE: NEGATIVE

## 2013-01-30 NOTE — Telephone Encounter (Signed)
  Follow up Call-  Call back number 01/27/2013  Post procedure Call Back phone  # [phone] cell  Permission to leave phone message Yes     Patient questions:  Do you have a fever, pain , or abdominal swelling? no Pain Score  0 *  Have you tolerated food without any problems? yes  Have you been able to return to your normal activities? yes  Do you have any questions about your discharge instructions: Diet   no Medications  no Follow up visit  no  Do you have questions or concerns about your Care? no  Actions: * If pain score is 4 or above: No action needed, pain <4.

## 2013-02-02 ENCOUNTER — Encounter: Payer: Self-pay | Admitting: Gastroenterology

## 2013-02-06 ENCOUNTER — Telehealth: Payer: Self-pay | Admitting: Gastroenterology

## 2013-02-06 NOTE — Telephone Encounter (Signed)
Informed pt of Dr Norval Gable recommendations to f/u with her PCP; pt stated understanding.

## 2013-02-06 NOTE — Telephone Encounter (Signed)
F/u her 1 care...issues here other than GI

## 2013-02-06 NOTE — Telephone Encounter (Signed)
Informed pt of her path results; she stated understanding. Pt wants to know what's next? Per Dr Norval Gable note he mentions Hydrogen Breath test. Also, she was placed on Questran and she states she tried it a couple of times, but the med is very expensive; explained she may be on this for life. What's next for pt; please advise? Thanks.

## 2013-02-07 ENCOUNTER — Encounter: Payer: Self-pay | Admitting: Gastroenterology

## 2013-02-28 ENCOUNTER — Encounter: Payer: Self-pay | Admitting: Nurse Practitioner

## 2013-02-28 ENCOUNTER — Telehealth: Payer: Self-pay | Admitting: Gastroenterology

## 2013-02-28 ENCOUNTER — Ambulatory Visit (INDEPENDENT_AMBULATORY_CARE_PROVIDER_SITE_OTHER): Payer: BC Managed Care – PPO | Admitting: Nurse Practitioner

## 2013-02-28 VITALS — BP 90/60 | HR 70 | Ht 65.25 in | Wt 237.6 lb

## 2013-02-28 DIAGNOSIS — K625 Hemorrhage of anus and rectum: Secondary | ICD-10-CM

## 2013-02-28 DIAGNOSIS — K649 Unspecified hemorrhoids: Secondary | ICD-10-CM

## 2013-02-28 MED ORDER — HYDROCORTISONE ACETATE 25 MG RE SUPP: 25.0000 mg | Freq: Two times a day (BID) | RECTAL | Status: DC

## 2013-02-28 NOTE — Telephone Encounter (Signed)
Patient reports increased rectal bleeding and rectal pain, bright red blood. She will come in today at 2:00 and see Willette Cluster RNP

## 2013-02-28 NOTE — Patient Instructions (Addendum)
We have sent the following medications to your pharmacy for you to pick up at your convenience: Asacol HD suppositories  Make sure you are taking your Questran twice a day, however, stop taking it if you become constipated.  Please follow up with Dr. Jarold Motto on 03-14-13 at 9:45am

## 2013-02-28 NOTE — Progress Notes (Signed)
  History of Present Illness:  Patient is a 27 year old female evaluated by Dr. Jarold Motto last month for diarrhea and rectal bleeding. Sabrina Cruz subsequently underwent colonoscopy which was completely normal except for external hemorrhoids. Random colon biopsies for diarrhea were normal. Patient also had an upper endoscopy with small bowel biopsies. Findings included erosive esophagitis. Small bowel biopsies normal. Patient has had loose stools since cholecystectomy in 2010. Over the last 6 months diarrhea has been much worse as it it not just postprandial now. We prescribed Questran which Sabrina Cruz took on 3 occasions without improvement. Patient is worked in today for recurrent rectal bleeding. Sabrina Cruz is having some mild rectal discomfort with defecation, a new problem.  Current Medications, Allergies, Past Medical History, Past Surgical History, Family History and Social History were reviewed in [employer] record.   Physical Exam: General: Pleasant black female in no acute distress Eyes:  sclerae anicteric, conjunctiva pink  Heart: Regular rate and rhythm Abdomen: Soft, non distended, non-tender. No masses, no hepatomegaly. Normal bowel sounds Rectal: Note external lesions seen. On endoscopy there were internal hemorrhoids, and possibly a posterior superficial fissure in the anal canal. Exam was limited by patient's discomfort Extremities: No edema  Neurological: Alert oriented x 4, grossly nonfocal Psychological:  Alert and cooperative. Normal mood and affect  Assessment and Recommendations:  1. recurrent rectal bleeding, now associated with mild pain with defecation. Suspect bleeding from internal hemorrhoids seen on anoscopy today. In addition, Sabrina Cruz may have a superficial fissure in the posterior wall of anal canal (limited exam secondary to patient's discomfort) . Will treat hemorrhoids with Anusol-HC suppositories twice daily for 10 days. Sabrina Cruz will come back for reassessment  following that. Depending on whether there has been any persistent bleeding and/or pain Sabrina Cruz may need diltiazem gel for possible fissure.  2. Chronic diarrhea. Sabrina Cruz did not give Questran adequate trial. 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

## 2013-03-01 ENCOUNTER — Encounter: Payer: Self-pay | Admitting: Nurse Practitioner

## 2013-03-01 DIAGNOSIS — K649 Unspecified hemorrhoids: Secondary | ICD-10-CM | POA: Insufficient documentation

## 2013-03-01 DIAGNOSIS — K625 Hemorrhage of anus and rectum: Secondary | ICD-10-CM | POA: Insufficient documentation

## 2013-03-14 ENCOUNTER — Ambulatory Visit (INDEPENDENT_AMBULATORY_CARE_PROVIDER_SITE_OTHER): Payer: BC Managed Care – PPO | Admitting: Gastroenterology

## 2013-03-14 ENCOUNTER — Encounter: Payer: Self-pay | Admitting: Gastroenterology

## 2013-03-14 VITALS — BP 102/68 | HR 86 | Ht 65.25 in | Wt 239.0 lb

## 2013-03-14 DIAGNOSIS — Z9049 Acquired absence of other specified parts of digestive tract: Secondary | ICD-10-CM

## 2013-03-14 DIAGNOSIS — Z9889 Other specified postprocedural states: Secondary | ICD-10-CM

## 2013-03-14 DIAGNOSIS — K219 Gastro-esophageal reflux disease without esophagitis: Secondary | ICD-10-CM

## 2013-03-14 DIAGNOSIS — K9089 Other intestinal malabsorption: Secondary | ICD-10-CM

## 2013-03-14 DIAGNOSIS — Z8719 Personal history of other diseases of the digestive system: Secondary | ICD-10-CM

## 2013-03-14 NOTE — Progress Notes (Signed)
History of Present Illness: This is a 27 year old African American female with postcholecystectomy diarrhea, currently asymptomatic on Questran 4 g every other day.  She was seen several weeks ago by a nurse practitioner, and is been treated for an acute anal fissure with good success.  She denies any rectal pain, bleeding, or other GI complaints at this time.  Patient does take Prilosec 20 mg a day for GERD.  There is no history of known lactose intolerance.    Current Medications, Allergies, Past Medical History, Past Surgical History, Family History and Social History were reviewed in [employer] record.  ROS: All systems were reviewed and are negative unless otherwise stated in the HPI.         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  I have urged her to adjust the dosage as needed to maintain bowel normalcy.  We will see her on when necessary basis the future as needed.  Rectal exam not performed today.  No diagnosis found.

## 2013-03-14 NOTE — Patient Instructions (Signed)
  Please follow up in one year or sooner if your having problems. _________________________________________________________________________________________                                               We are excited to introduce MyChart, a new best-in-class service that provides you online access to important information in your electronic medical record. We want to make it easier for you to view your health information - all in one secure location - when and where you need it. We expect MyChart will enhance the quality of care and service we provide.  When you register for MyChart, you can:    View your test results.    Request appointments and receive appointment reminders via email.    Request medication renewals.    View your medical history, allergies, medications and immunizations.    Communicate with your physician's office through a password-protected site.    Conveniently print information such as your medication lists.  To find out if MyChart is right for you, please talk to a member of our clinical staff today. We will gladly answer your questions about this free health and wellness tool.  If you are age 56 or older and want a member of your family to have access to your record, you must provide written consent by completing a proxy form available at our office. Please speak to our clinical staff about guidelines regarding accounts for patients younger than age 20.  As you activate your MyChart account and need any technical assistance, please call the MyChart technical support line at [phone]) or email your question to [email or web address]. If you email your question(s), please include your name, a return phone number and the best time to reach you.  If you have non-urgent health-related questions, you can send a message to our office through MyChart at [email or web address]. If you have a medical emergency, call 911.  Thank you for using MyChart  as your new health and wellness resource!   MyChart licensed from [employer],  4540-9811. Patents Pending.

## 2015-10-27 HISTORY — PX: WISDOM TOOTH EXTRACTION: SHX21

## 2020-09-16 ENCOUNTER — Other Ambulatory Visit: Payer: Self-pay

## 2020-09-16 ENCOUNTER — Encounter (HOSPITAL_BASED_OUTPATIENT_CLINIC_OR_DEPARTMENT_OTHER): Payer: Self-pay | Admitting: Obstetrics and Gynecology

## 2020-09-16 NOTE — Progress Notes (Signed)
Spoke w/ via phone for pre-op interview--- PT Lab needs dos----Urine preg (per anes.)/  Pre-op orders pending               Lab results------ no COVID test ------ 09-24-2020 @ 1230 Arrive at ------- 0530 NPO after MN  Medications to take morning of surgery ----- Prevacid Diabetic medication ----- n/a Patient Special Instructions ----- n/a Pre-Op special Istructions ----- sent inbox message to dr bovard in epic Patient verbalized understanding of instructions that were given at this phone interview. Patient denies shortness of breath, chest pain, fever, cough at this phone interview.

## 2020-09-24 ENCOUNTER — Other Ambulatory Visit (HOSPITAL_COMMUNITY): Payer: Self-pay

## 2020-11-01 ENCOUNTER — Other Ambulatory Visit: Payer: Self-pay

## 2020-11-01 ENCOUNTER — Encounter (HOSPITAL_BASED_OUTPATIENT_CLINIC_OR_DEPARTMENT_OTHER): Payer: Self-pay | Admitting: Obstetrics and Gynecology

## 2020-11-01 NOTE — Progress Notes (Signed)
Spoke w/ via phone for pre-op interview--- PT Lab needs dos--  CBC, BMP, Urine preg           Lab results------ no COVID test ------ 11-07-2020  @ 1030 Arrive at ------- 0530 NPO after MN w/ exception clear liquids  Until --- 0430 Medications to take morning of surgery ----- Prevacid Diabetic medication ----- n/a Patient Special Instructions ----- n/a Pre-Op special Istructions ----- n/a Patient verbalized understanding of instructions that were given at this phone interview. Patient denies shortness of breath, chest pain, fever, cough at this phone interview.

## 2020-11-04 NOTE — H&P (Signed)
Sabrina Cruz is an 35 y.o. female  G26P1011 with undesired fertility.  D/W pt r/b/a and permanence of BTL by B salpingectomy.  Also d/w pt expectations of surgery.  D/W pt other reversible options, pt desires permanent sterility.    Pertinent Gynecological History: OB History: G3T5176 TAB, SVD 01/19/08. Female 7#2  No abn pap 5/19 HR HPV neg Remote trich   Menstrual History:  No LMP recorded (lmp unknown).    Past Medical History:  Diagnosis Date  . GERD (gastroesophageal reflux disease)     Past Surgical History:  Procedure Laterality Date  . LAPAROSCOPIC CHOLECYSTECTOMY  2010  Wisdom Tooth Extraction  Family History  Problem Relation Age of Onset  . Diabetes Maternal Grandmother   . Colon cancer Neg Hx   . Esophageal cancer Neg Hx   . Rectal cancer Neg Hx   . Stomach cancer Neg Hx   HTN  Social History:  reports that she has never smoked. She has never used smokeless tobacco. She reports current alcohol use. 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   HENT: Negative.   Eyes: Negative.   Respiratory: Negative.   Cardiovascular: Negative.   Gastrointestinal: Negative.   Genitourinary: Negative.   Musculoskeletal: Negative.   Skin: Negative.   Neurological: Negative.   Psychiatric/Behavioral: Negative.     Height 5\' 6"  (1.676 m), weight 119.1 kg. Physical Exam Constitutional:      Appearance: Normal appearance. She is obese.  HENT:     Head: Normocephalic and atraumatic.  Cardiovascular:     Rate and Rhythm: Normal rate and regular rhythm.  Pulmonary:     Effort: Pulmonary effort is normal.     Breath sounds: Normal breath sounds.  Abdominal:     General: Bowel sounds are normal.     Palpations: Abdomen is soft.  Genitourinary:    General: Normal vulva.     Rectum: Normal.  Musculoskeletal:        General: Normal range of  motion.     Cervical back: Normal range of motion and neck supple.  Skin:    General: Skin is warm and dry.  Neurological:     General: No focal deficit present.     Mental Status: She is alert and oriented to person, place, and time.  Psychiatric:        Mood and Affect: Mood normal.        Behavior: Behavior normal.     Assessment/Plan: 34yo G2P1011 for BTL by B salpingectomy D/w pt r/b/a of surgery - BTL   Sabrina Cruz 11/04/2020, 2:50 PM

## 2020-11-05 ENCOUNTER — Ambulatory Visit: Payer: BC Managed Care – PPO

## 2020-11-07 ENCOUNTER — Encounter (HOSPITAL_COMMUNITY): Payer: Self-pay | Admitting: Certified Registered"

## 2020-11-07 ENCOUNTER — Other Ambulatory Visit (HOSPITAL_COMMUNITY)
Admission: RE | Admit: 2020-11-07 | Discharge: 2020-11-07 | Disposition: A | Discharge status: Home / Self Care | Payer: BC Managed Care – PPO | Source: Ambulatory Visit | Attending: Obstetrics and Gynecology | Admitting: Obstetrics and Gynecology

## 2020-11-07 ENCOUNTER — Encounter (HOSPITAL_BASED_OUTPATIENT_CLINIC_OR_DEPARTMENT_OTHER): Payer: Self-pay | Admitting: Obstetrics and Gynecology

## 2020-11-07 DIAGNOSIS — Z01812 Encounter for preprocedural laboratory examination: Secondary | ICD-10-CM | POA: Insufficient documentation

## 2020-11-07 DIAGNOSIS — U071 COVID-19: Secondary | ICD-10-CM | POA: Insufficient documentation

## 2020-11-07 HISTORY — DX: COVID-19: U07.1

## 2020-11-07 NOTE — Progress Notes (Signed)
CALLED [facility] PATIENT REQUESTING COVID TEST RESULTS SPOKE WITH [facility] AND TEST SENT TO [facility] TO BE RUN AND TO BE  CALLED TO ON CALL DR FOR DR Sandford Craze , PATIENT NEEDS RESULTS DUE TO SHE IS SPENDING NIGHT WITH IMMUNOCOMPRISED MOTHER FOR DRIVER TO SURGERY IN AM, PT GIVEN DR Alroy Bailiff OFFICE NUMBER

## 2020-11-07 NOTE — Progress Notes (Signed)
Patient called and was started on zithromycin for uri, covid test pending, patient instructed to call surgeon if fever above 100.0 degrees, patientreports cough with yellow-light green sputum had telehealth visit 11-06-2020 with dr Zadie Cleverly reece , patient started on zithromax and norel ad qid and promethazine cough syrup 6.25  Per 5 ml q 4-6- hours prn. Patient requesting covid test results due to spending night with immonocomprimized mother tonight for surgery tomorrow. Will call patient back at [phone] with covid test results if available.

## 2020-11-07 NOTE — Anesthesia Preprocedure Evaluation (Deleted)
Anesthesia Evaluation    Reviewed: Allergy & Precautions, Patient's Chart, lab work & pertinent test results  Airway        Dental   Pulmonary neg pulmonary ROS,           Cardiovascular Exercise Tolerance: Good      Neuro/Psych negative neurological ROS     GI/Hepatic Neg liver ROS, GERD  ,  Endo/Other  negative endocrine ROS  Renal/GU negative Renal ROS     Musculoskeletal negative musculoskeletal ROS (+)   Abdominal (+) + obese,   Peds  Hematology   Anesthesia Other Findings   Reproductive/Obstetrics                             Anesthesia Physical Anesthesia Plan  ASA: III  Anesthesia Plan: General   Post-op Pain Management:    Induction: Intravenous  PONV Risk Score and Plan: 4 or greater and Treatment may vary due to age or medical condition, Ondansetron, Midazolam and Dexamethasone  Airway Management Planned: Oral ETT  Additional Equipment: None  Intra-op Plan:   Post-operative Plan: Extubation in OR  Informed Consent:     Dental advisory given  Plan Discussed with: CRNA  Anesthesia Plan Comments:         Anesthesia Quick Evaluation

## 2020-11-08 ENCOUNTER — Ambulatory Visit (HOSPITAL_BASED_OUTPATIENT_CLINIC_OR_DEPARTMENT_OTHER)
Admission: RE | Admit: 2020-11-08 | Payer: BC Managed Care – PPO | Source: Home / Self Care | Admitting: Obstetrics and Gynecology

## 2020-11-08 HISTORY — DX: Acute upper respiratory infection, unspecified: J06.9

## 2020-11-08 LAB — SARS CORONAVIRUS 2 BY RT PCR (HOSPITAL ORDER, PERFORMED IN ~~LOC~~ HOSPITAL LAB): SARS Coronavirus 2: POSITIVE — AB

## 2020-11-08 SURGERY — SALPINGECTOMY, BILATERAL, LAPAROSCOPIC
Anesthesia: General | Laterality: Bilateral

## 2021-05-15 ENCOUNTER — Other Ambulatory Visit: Payer: Self-pay

## 2021-05-15 ENCOUNTER — Encounter (HOSPITAL_BASED_OUTPATIENT_CLINIC_OR_DEPARTMENT_OTHER): Payer: Self-pay | Admitting: Obstetrics and Gynecology

## 2021-05-15 NOTE — Progress Notes (Signed)
Spoke w/ via phone for pre-op interview---PT Lab needs dos----Urine Preg            Lab results------none COVID test -----patient states asymptomatic no test needed Arrive at -------0645 NPO after MN NO Solid Food.  Clear liquids from MN until---0545 Med rec completed Medications to take morning of surgery -----prevacid Diabetic medication -----none Patient instructed no nail polish to be worn day of surgery Patient instructed to bring photo id and insurance card day of surgery Patient aware to have Driver (ride ) / mother Oriya Glanzer caregiver    for 24 hours after surgery  Patient Special Instructions -----none Pre-Op special Istructions -----none Patient verbalized understanding of instructions that were given at this phone interview. Patient denies shortness of breath, chest pain, fever, cough at this phone interview.  Orders requested from Dr. Jacklyn Shell via Epic IB

## 2021-05-21 NOTE — H&P (Signed)
Sabrina Cruz is an 35 y.o. female G36P1011 with undesired fertility and c/o heavy menses - desires management.  D/W pt r/b/a of BTL and hysteroscopy w Minerva endometrail ablation.  Also d/w pt procedure and expectations.  D/W pt reversible forms of contraception - desires permanent.  Also desire decreased menstrual cycles.    Pertinent Gynecological History: [id number] TAB, SVD 2009 - Sabrina Cruz + abn pap, nl since + STD - remote trich  Menstrual History:  Patient's last menstrual period was 03/27/2021 (exact date).    Past Medical History:  Diagnosis Date   Anxiety    COVID 11/07/2020   Dyspnea    SINCE COVID I/13/22 WITH ACTIVITY   GERD (gastroesophageal reflux disease)    URI (upper respiratory infection)    started zithromax 11-06-2020    Past Surgical History:  Procedure Laterality Date   LAPAROSCOPIC CHOLECYSTECTOMY  2010   WISDOM TOOTH EXTRACTION  2017    Family History  Problem Relation Age of Onset   Diabetes Maternal Grandmother    Colon cancer Neg Hx    Esophageal cancer Neg Hx    Rectal cancer Neg Hx    Stomach cancer Neg Hx     Social History:  reports that she has never smoked. She has never used smokeless tobacco. She reports current alcohol use. 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   HENT: Negative.    Eyes: Negative.   Respiratory: Negative.    Cardiovascular: Negative.   Gastrointestinal: Negative.   Genitourinary:  Positive for menstrual problem.  Musculoskeletal: Negative.   Skin: Negative.   Neurological: Negative.   Psychiatric/Behavioral: Negative.     Height '5\' 6"'$  (1.676 m), weight 118.8 kg, last menstrual period 03/27/2021. Physical Exam Constitutional:      Appearance: Normal appearance.  HENT:     Head: Atraumatic.  Cardiovascular:      Rate and Rhythm: Normal rate and regular rhythm.  Pulmonary:     Effort: Pulmonary effort is normal.     Breath sounds: Normal breath sounds.  Abdominal:     General: Bowel sounds are normal.     Palpations: Abdomen is soft.  Genitourinary:    General: Normal vulva.  Musculoskeletal:        General: Normal range of motion.     Cervical back: Normal range of motion and neck supple.  Skin:    General: Skin is warm and dry.  Neurological:     General: No focal deficit present.     Mental Status: She is alert and oriented to person, place, and time.  Psychiatric:        Mood and Affect: Mood normal.        Behavior: Behavior normal.    EMB benign  [place] nl SIUS    Assessment/Plan: 34yo G2P1011 for BTL, hysteroscopy and Minerva endometrial ablation D/w pt r/b/a, process and expectations Will proceed  Sabrina Cruz 05/21/2021, 7:43 PM

## 2021-05-23 ENCOUNTER — Ambulatory Visit (HOSPITAL_BASED_OUTPATIENT_CLINIC_OR_DEPARTMENT_OTHER): Payer: BC Managed Care – PPO | Admitting: Anesthesiology

## 2021-05-23 ENCOUNTER — Ambulatory Visit (HOSPITAL_BASED_OUTPATIENT_CLINIC_OR_DEPARTMENT_OTHER)
Admission: RE | Admit: 2021-05-23 | Discharge: 2021-05-23 | Disposition: A | Discharge status: Home / Self Care | Payer: BC Managed Care – PPO | Attending: Obstetrics and Gynecology | Admitting: Obstetrics and Gynecology

## 2021-05-23 ENCOUNTER — Encounter (HOSPITAL_BASED_OUTPATIENT_CLINIC_OR_DEPARTMENT_OTHER): Payer: Self-pay | Admitting: Obstetrics and Gynecology

## 2021-05-23 ENCOUNTER — Encounter (HOSPITAL_BASED_OUTPATIENT_CLINIC_OR_DEPARTMENT_OTHER)
Admission: RE | Disposition: A | Discharge status: Home / Self Care | Payer: Self-pay | Source: Home / Self Care | Attending: Obstetrics and Gynecology

## 2021-05-23 DIAGNOSIS — Z833 Family history of diabetes mellitus: Secondary | ICD-10-CM | POA: Diagnosis not present

## 2021-05-23 DIAGNOSIS — N920 Excessive and frequent menstruation with regular cycle: Secondary | ICD-10-CM | POA: Diagnosis not present

## 2021-05-23 DIAGNOSIS — Z302 Encounter for sterilization: Secondary | ICD-10-CM | POA: Insufficient documentation

## 2021-05-23 DIAGNOSIS — N838 Other noninflammatory disorders of ovary, fallopian tube and broad ligament: Secondary | ICD-10-CM | POA: Insufficient documentation

## 2021-05-23 DIAGNOSIS — Z79899 Other long term (current) drug therapy: Secondary | ICD-10-CM | POA: Diagnosis not present

## 2021-05-23 DIAGNOSIS — Z791 Long term (current) use of non-steroidal anti-inflammatories (NSAID): Secondary | ICD-10-CM | POA: Diagnosis not present

## 2021-05-23 DIAGNOSIS — Z8616 Personal history of COVID-19: Secondary | ICD-10-CM | POA: Diagnosis not present

## 2021-05-23 DIAGNOSIS — Z888 Allergy status to other drugs, medicaments and biological substances status: Secondary | ICD-10-CM | POA: Insufficient documentation

## 2021-05-23 HISTORY — PX: DILATATION AND CURETTAGE/HYSTEROSCOPY WITH MINERVA: SHX6851

## 2021-05-23 HISTORY — DX: Anxiety disorder, unspecified: F41.9

## 2021-05-23 HISTORY — PX: LAPAROSCOPIC BILATERAL SALPINGECTOMY: SHX5889

## 2021-05-23 HISTORY — DX: Dyspnea, unspecified: R06.00

## 2021-05-23 LAB — CBC
HCT: 36.4 % (ref 36.0–46.0)
Hemoglobin: 12.5 g/dL (ref 12.0–15.0)
MCH: 30 pg (ref 26.0–34.0)
MCHC: 34.3 g/dL (ref 30.0–36.0)
MCV: 87.5 fL (ref 80.0–100.0)
Platelets: 308 10*3/uL (ref 150–400)
RBC: 4.16 MIL/uL (ref 3.87–5.11)
RDW: 12.1 % (ref 11.5–15.5)
WBC: 7.9 10*3/uL (ref 4.0–10.5)
nRBC: 0 % (ref 0.0–0.2)

## 2021-05-23 LAB — POCT PREGNANCY, URINE: Preg Test, Ur: NEGATIVE

## 2021-05-23 SURGERY — SALPINGECTOMY, BILATERAL, LAPAROSCOPIC
Anesthesia: General

## 2021-05-23 MED ORDER — ACETAMINOPHEN 500 MG PO TABS
1000.0000 mg | ORAL_TABLET | Freq: Once | ORAL | Status: AC
  Administered 2021-05-23: 1000 mg via ORAL

## 2021-05-23 MED ORDER — ONDANSETRON HCL 4 MG/2ML IJ SOLN
INTRAMUSCULAR | Status: AC
  Filled 2021-05-23: qty 2

## 2021-05-23 MED ORDER — PHENYLEPHRINE 40 MCG/ML (10ML) SYRINGE FOR IV PUSH (FOR BLOOD PRESSURE SUPPORT)
PREFILLED_SYRINGE | INTRAVENOUS | Status: DC | PRN
  Administered 2021-05-23: 80 ug via INTRAVENOUS

## 2021-05-23 MED ORDER — ACETAMINOPHEN 500 MG PO TABS
ORAL_TABLET | ORAL | Status: AC
  Filled 2021-05-23: qty 2

## 2021-05-23 MED ORDER — DEXAMETHASONE SODIUM PHOSPHATE 10 MG/ML IJ SOLN
INTRAMUSCULAR | Status: DC | PRN
  Administered 2021-05-23: 10 mg via INTRAVENOUS

## 2021-05-23 MED ORDER — LACTATED RINGERS IV SOLN: INTRAVENOUS | Status: DC

## 2021-05-23 MED ORDER — MIDAZOLAM HCL 2 MG/2ML IJ SOLN
INTRAMUSCULAR | Status: AC
  Filled 2021-05-23: qty 2

## 2021-05-23 MED ORDER — IBUPROFEN 800 MG PO TABS: 800.0000 mg | ORAL_TABLET | Freq: Three times a day (TID) | ORAL | 1 refills | Status: DC | PRN

## 2021-05-23 MED ORDER — LIDOCAINE HCL 1 % IJ SOLN
INTRAMUSCULAR | Status: DC | PRN
  Administered 2021-05-23: 10 mL

## 2021-05-23 MED ORDER — ROCURONIUM BROMIDE 10 MG/ML (PF) SYRINGE
PREFILLED_SYRINGE | INTRAVENOUS | Status: DC | PRN
  Administered 2021-05-23: 20 mg via INTRAVENOUS
  Administered 2021-05-23: 80 mg via INTRAVENOUS

## 2021-05-23 MED ORDER — FENTANYL CITRATE (PF) 250 MCG/5ML IJ SOLN
INTRAMUSCULAR | Status: AC
  Filled 2021-05-23: qty 5

## 2021-05-23 MED ORDER — KETOROLAC TROMETHAMINE 30 MG/ML IJ SOLN
INTRAMUSCULAR | Status: DC | PRN
  Administered 2021-05-23: 30 mg via INTRAVENOUS

## 2021-05-23 MED ORDER — ESMOLOL HCL 100 MG/10ML IV SOLN
INTRAVENOUS | Status: AC
  Filled 2021-05-23: qty 10

## 2021-05-23 MED ORDER — ONDANSETRON HCL 4 MG/2ML IJ SOLN
INTRAMUSCULAR | Status: DC | PRN
  Administered 2021-05-23: 4 mg via INTRAVENOUS

## 2021-05-23 MED ORDER — MIDAZOLAM HCL 5 MG/5ML IJ SOLN
INTRAMUSCULAR | Status: DC | PRN
  Administered 2021-05-23: 2 mg via INTRAVENOUS

## 2021-05-23 MED ORDER — BUPIVACAINE HCL (PF) 0.25 % IJ SOLN
INTRAMUSCULAR | Status: DC | PRN
  Administered 2021-05-23: 10 mL

## 2021-05-23 MED ORDER — PHENYLEPHRINE 40 MCG/ML (10ML) SYRINGE FOR IV PUSH (FOR BLOOD PRESSURE SUPPORT)
PREFILLED_SYRINGE | INTRAVENOUS | Status: AC
  Filled 2021-05-23: qty 10

## 2021-05-23 MED ORDER — FENTANYL CITRATE (PF) 100 MCG/2ML IJ SOLN
25.0000 ug | INTRAMUSCULAR | Status: DC | PRN
  Administered 2021-05-23 (×2): 25 ug via INTRAVENOUS

## 2021-05-23 MED ORDER — PROPOFOL 10 MG/ML IV BOLUS
INTRAVENOUS | Status: DC | PRN
  Administered 2021-05-23: 130 mg via INTRAVENOUS

## 2021-05-23 MED ORDER — FENTANYL CITRATE (PF) 100 MCG/2ML IJ SOLN
INTRAMUSCULAR | Status: AC
  Filled 2021-05-23: qty 2

## 2021-05-23 MED ORDER — LIDOCAINE 2% (20 MG/ML) 5 ML SYRINGE
INTRAMUSCULAR | Status: DC | PRN
  Administered 2021-05-23: 60 mg via INTRAVENOUS

## 2021-05-23 MED ORDER — OXYCODONE HCL 5 MG PO TABS
5.0000 mg | ORAL_TABLET | Freq: Once | ORAL | Status: AC
  Administered 2021-05-23: 5 mg via ORAL

## 2021-05-23 MED ORDER — OXYCODONE HCL 5 MG PO TABS
ORAL_TABLET | ORAL | Status: AC
  Filled 2021-05-23: qty 1

## 2021-05-23 MED ORDER — SUGAMMADEX SODIUM 200 MG/2ML IV SOLN
INTRAVENOUS | Status: DC | PRN
  Administered 2021-05-23: 200 mg via INTRAVENOUS

## 2021-05-23 MED ORDER — DEXAMETHASONE SODIUM PHOSPHATE 10 MG/ML IJ SOLN
INTRAMUSCULAR | Status: AC
  Filled 2021-05-23: qty 1

## 2021-05-23 MED ORDER — OXYCODONE HCL 5 MG PO TABS: 5.0000 mg | ORAL_TABLET | ORAL | 0 refills | Status: DC | PRN

## 2021-05-23 MED ORDER — PROPOFOL 10 MG/ML IV BOLUS
INTRAVENOUS | Status: AC
  Filled 2021-05-23: qty 20

## 2021-05-23 MED ORDER — ROCURONIUM BROMIDE 10 MG/ML (PF) SYRINGE
PREFILLED_SYRINGE | INTRAVENOUS | Status: AC
  Filled 2021-05-23: qty 10

## 2021-05-23 MED ORDER — POVIDONE-IODINE 10 % EX SWAB: 2.0000 "application " | Freq: Once | CUTANEOUS | Status: DC

## 2021-05-23 MED ORDER — FENTANYL CITRATE (PF) 100 MCG/2ML IJ SOLN
INTRAMUSCULAR | Status: DC | PRN
  Administered 2021-05-23: 100 ug via INTRAVENOUS
  Administered 2021-05-23 (×3): 50 ug via INTRAVENOUS

## 2021-05-23 SURGICAL SUPPLY — 29 items
ADH SKN CLS APL DERMABOND .7 (GAUZE/BANDAGES/DRESSINGS) ×2
DERMABOND ADVANCED (GAUZE/BANDAGES/DRESSINGS) ×1
DERMABOND ADVANCED .7 DNX12 (GAUZE/BANDAGES/DRESSINGS) ×2 IMPLANT
DURAPREP 26ML APPLICATOR (WOUND CARE) ×3 IMPLANT
GAUZE 4X4 16PLY ~~LOC~~+RFID DBL (SPONGE) ×6 IMPLANT
GLOVE SURG ENC MOIS LTX SZ6.5 (GLOVE) ×6 IMPLANT
GOWN STRL REUS W/TWL LRG LVL3 (GOWN DISPOSABLE) ×7 IMPLANT
HANDPIECE ABLA MINERVA ENDO (MISCELLANEOUS) ×1 IMPLANT
KIT PROCEDURE FLUENT (KITS) ×1 IMPLANT
KIT TURNOVER CYSTO (KITS) ×3 IMPLANT
NDL SPNL 18GX3.5 QUINCKE PK (NEEDLE) IMPLANT
NEEDLE INSUFFLATION 120MM (ENDOMECHANICALS) ×3 IMPLANT
NEEDLE SPNL 18GX3.5 QUINCKE PK (NEEDLE) ×3 IMPLANT
PACK LAPAROSCOPY BASIN (CUSTOM PROCEDURE TRAY) ×3 IMPLANT
PACK WEDGE TRENDGUARD 450 PROC (MISCELLANEOUS) ×2 IMPLANT
PAD OB MATERNITY 4.3X12.25 (PERSONAL CARE ITEMS) ×3 IMPLANT
PAD PREP 24X48 CUFFED NSTRL (MISCELLANEOUS) ×3 IMPLANT
SET TUBE SMOKE EVAC HIGH FLOW (TUBING) ×3 IMPLANT
SHEARS HARMONIC ACE PLUS 36CM (ENDOMECHANICALS) ×1 IMPLANT
SLEEVE SCD COMPRESS KNEE MED (STOCKING) ×5 IMPLANT
SLEEVE XCEL OPT CAN 5 100 (ENDOMECHANICALS) ×2 IMPLANT
SUT VIC AB 2-0 SH 27 (SUTURE) ×3
SUT VIC AB 2-0 SH 27XBRD (SUTURE) IMPLANT
SUT VIC AB 4-0 PS2 18 (SUTURE) ×3 IMPLANT
TOWEL OR 17X26 10 PK STRL BLUE (TOWEL DISPOSABLE) ×3 IMPLANT
TRAY FOLEY W/BAG SLVR 14FR LF (SET/KITS/TRAYS/PACK) ×3 IMPLANT
TRENDGUARD 450 WEDGE PROC PACK (MISCELLANEOUS) ×3
TROCAR BLADELESS OPT 5 100 (ENDOMECHANICALS) ×5 IMPLANT
WARMER LAPAROSCOPE (MISCELLANEOUS) ×3 IMPLANT

## 2021-05-23 NOTE — Anesthesia Procedure Notes (Addendum)
Procedure Name: Intubation Date/Time: 05/23/2021 8:50 AM Performed by: Rogers Blocker, CRNA Pre-anesthesia Checklist: Patient identified, Emergency Drugs available, Suction available and Patient being monitored Patient Re-evaluated:Patient Re-evaluated prior to induction Oxygen Delivery Method: Circle System Utilized Preoxygenation: Pre-oxygenation with 100% oxygen Induction Type: IV induction Ventilation: Mask ventilation without difficulty Laryngoscope Size: Mac and 3 Grade View: Grade I Tube type: Oral Number of attempts: 1 Airway Equipment and Method: Stylet Placement Confirmation: ETT inserted through vocal cords under direct vision, positive ETCO2 and breath sounds checked- equal and bilateral Secured at: 22 cm Tube secured with: Tape Dental Injury: Teeth and Oropharynx as per pre-operative assessment

## 2021-05-23 NOTE — Op Note (Signed)
NAME: Sabrina Cruz, DIAB MEDICAL RECORD NO: [id number] ACCOUNT NO: [id number] DATE OF BIRTH: 02-13-1986 FACILITY: [facility] LOCATION: [facility] PHYSICIAN: Janyth Contes, MD  Operative Report   DATE OF PROCEDURE: 05/23/2021   PREOPERATIVE DIAGNOSIS:  Undesired fertility, menorrhagia.  POSTOPERATIVE DIAGNOSIS:  Undesired fertility, menorrhagia, status post bilateral salpingectomy and Minerva ablation.  PROCEDURE:  Laparoscopic bilateral salpingectomy and hysteroscopy with Minerva endometrial ablation.  SURGEON:  Janyth Contes, MD  ANESTHESIA:  Local and general.  ESTIMATED BLOOD LOSS:  30 mL.  INTRAVENOUS FLUIDS AND URINE OUTPUT:  Per anesthesia.  COMPLICATIONS:  None.  PATHOLOGY:  Bilateral tubal segments.  DESCRIPTION OF PROCEDURE:  After informed consent was reviewed with the patient including risks, benefits and alternatives of the surgical procedure she was transported to the operating room and placed on the table in supine position.  General anesthesia  was induced and found to be adequate.  She was then placed in the Y[place]in stirrups, prepped and draped in the normal sterile fashion.  An appropriate timeout was performed.  A Hulka manipulator was placed in her uterus.  Gloves and gowns were changed  and attention was turned to the abdominal portion of the case.  An approximately 5 mm infraumbilical incision was made using Veress needle.  Pneumoperitoneum was obtained.  The trocar with an opening pressure of 0 mmHg.  A 5 mm trocar was placed through  the umbilicus under direct visualization.  Accessory ports were placed in both the right and left with direct visualization as well.  Brief pelvic survey performed revealed normal uterus, bilateral tubes and ovaries.  The right tube was grasped at the  fimbriated end, excised to the level of the cornu with the aid of a Harmonic scalpel.  Attention was turned to the left tube, which in a similar fashion was grasped at the  fimbriated end and excised to the level of the cornua with the Harmonic scalpel.   The tubes were removed through the trocars and hemostasis was assured.  The trocars were then closed with 3-0 Vicryl in subcuticular fashion and Dermabond.  Attention was turned to the pelvic portion of the case.  Hysteroscope was performed revealing  normal uterine cavity.  Bilateral ostia were visualized prior to evaluation of the cavity.  The patient's sounding length was found to be 8.5 cm in length.  The cervix was 3.5 cm, giving her cavity length of 5 cm.  The cervix was further dilated to  accommodate the Minerva endometrial ablation device was introduced into the cavity and after passing the test of perforation, it was deployed for 120 seconds.  The patient tolerated the procedure well.  The instrumentation was removed from her uterus,  her cervix was not bleeding.  The speculum was removed from the vagina.  Sponge, lap, and needle count was correct x2 per the operating staff.   [email or web address] D: 05/23/2021 10:40:11 am T: 05/23/2021 10:59:00 am  JOB: [id number]

## 2021-05-23 NOTE — Brief Op Note (Signed)
05/23/2021  10:34 AM  PATIENT:  Sabrina Cruz  35 y.o. female  PRE-OPERATIVE DIAGNOSIS:  sterilization Z30.2, Menorrhagia N92.0  POST-OPERATIVE DIAGNOSIS:  sterilization Z30.2, Menorrhagia N92.0  PROCEDURE:  Procedure(s): LAPAROSCOPIC BILATERAL SALPINGECTOMY (Bilateral) DILATATION AND CURETTAGE/HYSTEROSCOPY WITH MINERVA (N/A)  SURGEON:  Surgeon(s) and Role:    * Bovard-Stuckert, Anyae Griffith, MD - Primary  ANESTHESIA:   local and general  EBL:  30 mL IVF and uop per anesthesia  BLOOD ADMINISTERED:none  DRAINS: none   LOCAL MEDICATIONS USED:  MARCAINE    and LIDOCAINE   SPECIMEN:  Source of Specimen:  B tubal segments  DISPOSITION OF SPECIMEN:  PATHOLOGY  COUNTS:  YES  TOURNIQUET:  * No tourniquets in log *  DICTATION: .Other Dictation: Dictation Number [id number]  PLAN OF CARE: Discharge to home after PACU  PATIENT DISPOSITION:  PACU - hemodynamically stable.   Delay start of Pharmacological VTE agent (>24hrs) due to surgical blood loss or risk of bleeding: not applicable

## 2021-05-23 NOTE — Addendum Note (Signed)
Addendum  created 05/23/21 1248 by Bonney Aid, CRNA   Charge Capture section accepted

## 2021-05-23 NOTE — Interval H&P Note (Signed)
History and Physical Interval Note:  05/23/2021 8:22 AM  Sabrina Cruz  has presented today for surgery, with the diagnosis of sterilization Z30.2, Menorrhagia N92.0.  The various methods of treatment have been discussed with the patient and family. After consideration of risks, benefits and other options for treatment, the patient has consented to  Procedure(s): LAPAROSCOPIC BILATERAL SALPINGECTOMY (Bilateral) DILATATION AND CURETTAGE/HYSTEROSCOPY WITH MINERVA (N/A) as a surgical intervention.  The patient's history has been reviewed, patient examined, no change in status, stable for surgery.  I have reviewed the patient's chart and labs.  Questions were answered to the patient's satisfaction.     Sabrina Cruz

## 2021-05-23 NOTE — Anesthesia Preprocedure Evaluation (Addendum)
Anesthesia Evaluation  Patient identified by MRN, date of birth, ID band Patient awake    Reviewed: Allergy & Precautions, NPO status , Patient's Chart, lab work & pertinent test results  Airway Mallampati: III  TM Distance: >3 FB Neck ROM: Full    Dental no notable dental hx. (+) Teeth Intact, Dental Advisory Given   Pulmonary neg pulmonary ROS,    Pulmonary exam normal breath sounds clear to auscultation       Cardiovascular negative cardio ROS Normal cardiovascular exam Rhythm:Regular Rate:Normal     Neuro/Psych PSYCHIATRIC DISORDERS Anxiety negative neurological ROS     GI/Hepatic Neg liver ROS, GERD  ,  Endo/Other  Morbid obesity (BMI 42)  Renal/GU negative Renal ROS  negative genitourinary   Musculoskeletal negative musculoskeletal ROS (+)   Abdominal   Peds  Hematology negative hematology ROS (+)   Anesthesia Other Findings   Reproductive/Obstetrics                            Anesthesia Physical Anesthesia Plan  ASA: 2  Anesthesia Plan: General   Post-op Pain Management:    Induction: Intravenous  PONV Risk Score and Plan: 3 and Midazolam, Dexamethasone and Ondansetron  Airway Management Planned: Oral ETT  Additional Equipment:   Intra-op Plan:   Post-operative Plan: Extubation in OR  Informed Consent: I have reviewed the patients History and Physical, chart, labs and discussed the procedure including the risks, benefits and alternatives for the proposed anesthesia with the patient or authorized representative who has indicated his/her understanding and acceptance.     Dental advisory given  Plan Discussed with: CRNA  Anesthesia Plan Comments:         Anesthesia Quick Evaluation

## 2021-05-23 NOTE — Transfer of Care (Signed)
Immediate Anesthesia Transfer of Care Note  Patient: Sabrina Cruz  Procedure(s) Performed: LAPAROSCOPIC BILATERAL SALPINGECTOMY (Bilateral) DILATATION AND CURETTAGE/HYSTEROSCOPY WITH MINERVA  Patient Location: PACU  Anesthesia Type:General  Level of Consciousness: awake, alert , oriented and patient cooperative  Airway & Oxygen Therapy: Patient Spontanous Breathing and Patient connected to face mask oxygen  Post-op Assessment: Report given to RN and Post -op Vital signs reviewed and stable  Post vital signs: Reviewed and stable  Last Vitals:  Vitals Value Taken Time  BP 127/85 05/23/21 1045  Temp    Pulse 98 05/23/21 1046  Resp 23 05/23/21 1046  SpO2 100 % 05/23/21 1046  Vitals shown include unvalidated device data.  Last Pain:  Vitals:   05/23/21 0711  TempSrc: Oral  PainSc: 0-No pain      Patients Stated Pain Goal: 5 ([phone] [id number])  Complications: No notable events documented.

## 2021-05-23 NOTE — Discharge Instructions (Addendum)
NO TYLENOL CONTAINING PRODUCTS UNTIL AFTER 1:20 PM TODAY.  NO OTHER IBUPROFEN OR NAPROXEN CONTAINING PRODUCTS WHILE TAKING PRESCRIPTION IBUPROFEN.    DISCHARGE INSTRUCTIONS: Laparoscopy  The following instructions have been prepared to help you care for yourself upon your return home today.  Wound care:  Do not get the incision wet for the first 24 hours. The incision should be kept clean and dry.  The Band-Aids or dressings may be removed the day after surgery.  Should the incision become sore, red, and swollen after the first week, check with your doctor.  Personal hygiene:  Shower the day after your procedure.  Activity and limitations:  Do NOT drive or operate any equipment today.  Do NOT lift anything more than 15 pounds for 2-3 weeks after surgery.  Do NOT rest in bed all day.  Walking is encouraged. Walk each day, starting slowly with 5-minute walks 3 or 4 times a day. Slowly increase the length of your walks.  Walk up and down stairs slowly.  Do NOT do strenuous activities, such as golfing, playing tennis, bowling, running, biking, weight lifting, gardening, mowing, or vacuuming for 2-4 weeks. Ask your doctor when it is okay to start.  Diet: Eat a light meal as desired this evening. You may resume your usual diet tomorrow.  Return to work: This is dependent on the type of work you do. For the most part you can return to a desk job within a week of surgery. If you are more active at work, please discuss this with your doctor.  What to expect after your surgery: You may have a slight burning sensation when you urinate on the first day. You may have a very small amount of blood in the urine. Expect to have a small amount of vaginal discharge/light bleeding for 1-2 weeks. It is not unusual to have abdominal soreness and bruising for up to 2 weeks. You may be tired and need more rest for about 1 week. You may experience shoulder pain for 24-72 hours. Lying flat in bed may relieve  it.  Call your doctor for any of the following:  Develop a fever of 100.4 or greater  Inability to urinate 6 hours after discharge from hospital  Severe pain not relieved by pain medications  Persistent of heavy bleeding at incision site  Redness or swelling around incision site after a week  Increasing nausea or vomiting    Post Anesthesia Home Care Instructions  Activity: Get plenty of rest for the remainder of the day. A responsible individual must stay with you for 24 hours following the procedure.  For the next 24 hours, DO NOT: -Drive a car -Paediatric nurse -Drink alcoholic beverages -Take any medication unless instructed by your physician -Make any legal decisions or sign important papers.  Meals: Start with liquid foods such as gelatin or soup. Progress to regular foods as tolerated. Avoid greasy, spicy, heavy foods. If nausea and/or vomiting occur, drink only clear liquids until the nausea and/or vomiting subsides. Call your physician if vomiting continues.  Special Instructions/Symptoms: Your throat may feel dry or sore from the anesthesia or the breathing tube placed in your throat during surgery. If this causes discomfort, gargle with warm salt water. The discomfort should disappear within 24 hours.

## 2021-05-23 NOTE — Anesthesia Postprocedure Evaluation (Signed)
Anesthesia Post Note  Patient: Sabrina Cruz  Procedure(s) Performed: LAPAROSCOPIC BILATERAL SALPINGECTOMY (Bilateral) DILATATION AND CURETTAGE/HYSTEROSCOPY WITH MINERVA     Patient location during evaluation: PACU Anesthesia Type: General Level of consciousness: awake and alert Pain management: pain level controlled Vital Signs Assessment: post-procedure vital signs reviewed and stable Respiratory status: spontaneous breathing, nonlabored ventilation, respiratory function stable and patient connected to nasal cannula oxygen Cardiovascular status: blood pressure returned to baseline and stable Postop Assessment: no apparent nausea or vomiting Anesthetic complications: no   No notable events documented.  Last Vitals:  Vitals:   05/23/21 1130 05/23/21 1137  BP: 122/77   Pulse: 93 87  Resp: (!) 24 18  Temp:    SpO2: 94% 93%    Last Pain:  Vitals:   05/23/21 1150  TempSrc:   PainSc: 4                  Breylin Dom L Levern Pitter

## 2021-05-26 ENCOUNTER — Encounter (HOSPITAL_BASED_OUTPATIENT_CLINIC_OR_DEPARTMENT_OTHER): Payer: Self-pay | Admitting: Obstetrics and Gynecology

## 2021-05-26 LAB — SURGICAL PATHOLOGY

## 2022-08-07 ENCOUNTER — Ambulatory Visit: Payer: Commercial Managed Care - HMO | Admitting: Pulmonary Disease

## 2022-08-07 ENCOUNTER — Encounter: Payer: Self-pay | Admitting: Pulmonary Disease

## 2022-08-07 ENCOUNTER — Ambulatory Visit (INDEPENDENT_AMBULATORY_CARE_PROVIDER_SITE_OTHER): Payer: Commercial Managed Care - HMO

## 2022-08-07 VITALS — BP 120/70 | HR 87 | Ht 66.0 in | Wt 278.8 lb

## 2022-08-07 DIAGNOSIS — R0602 Shortness of breath: Secondary | ICD-10-CM

## 2022-08-07 MED ORDER — ALBUTEROL SULFATE HFA 108 (90 BASE) MCG/ACT IN AERS: 2.0000 | INHALATION_SPRAY | Freq: Four times a day (QID) | RESPIRATORY_TRACT | 6 refills | Status: AC | PRN

## 2022-08-07 MED ORDER — FLUTICASONE FUROATE-VILANTEROL 100-25 MCG/ACT IN AEPB: 1.0000 | INHALATION_SPRAY | Freq: Every day | RESPIRATORY_TRACT | 2 refills | Status: DC

## 2022-08-07 MED ORDER — AZITHROMYCIN 250 MG PO TABS: 250.0000 mg | ORAL_TABLET | Freq: Every day | ORAL | 0 refills | Status: DC

## 2022-08-07 NOTE — Patient Instructions (Addendum)
Chest x-ray  PFT at that visit  I will call you in a course of antibiotics-use the antibiotics if you feel you are still feeling poorly  Use the course of steroids you have on hand  Inhaler -Breo to be used daily -Ensure you rinse your mouth following use  Rescue albuterol to be used up to 4 times a day as needed -2 puffs as needed for shortness of breath/wheezing  Follow-up in about 6 to 8 weeks

## 2022-08-07 NOTE — Progress Notes (Signed)
Sabrina Cruz    [id number]    05-Oct-1986  Primary Care Physician:Reese, Zadie Cleverly, MD  Referring Physician: Lin Landsman, [place],  [place] [address]  Chief complaint:   Patient is being seen for chronic cough  HPI:  Has had chronic cough for many months Productive of greenish mucus in the morning, clear during the day  Decreased activity tolerance with shortness of breath  Denies any chest pains or chest discomfort  No past history of asthma or significant allergies  Did have COVID in January 2021 with symptoms lasting about a week, did have a cough and sputum production at the time Recurrence of COVID infection September 2021  Cough, sputum production  Never smoker No significant secondhand smoke exposure  Does have a pet dog has had the dog for about 8 years, not allergic to dogs as known to her  No family history of asthma  Admits to some nasal congestion  Outpatient Encounter Medications as of 08/07/2022  Medication Sig   albuterol (VENTOLIN HFA) 108 (90 Base) MCG/ACT inhaler Inhale 2 puffs into the lungs every 6 (six) hours as needed for wheezing or shortness of breath.   azithromycin (ZITHROMAX) 250 MG tablet Take 1 tablet (250 mg total) by mouth daily. 2 tablets on day 1 and then daily for 4 days   fexofenadine-pseudoephedrine (ALLEGRA-D 24) 180-240 MG 24 hr tablet Take 1 tablet by mouth as needed.   fluticasone furoate-vilanterol (BREO ELLIPTA) 100-25 MCG/ACT AEPB Inhale 1 puff into the lungs daily.   ibuprofen (ADVIL) 800 MG tablet Take 1 tablet (800 mg total) by mouth every 8 (eight) hours as needed.   lansoprazole (PREVACID) 30 MG capsule Take 30 mg by mouth daily as needed.   Multiple Vitamin (MULITIVITAMIN WITH MINERALS) TABS Take 1 tablet by mouth daily.   oxyCODONE (ROXICODONE) 5 MG immediate release tablet Take 1 tablet (5 mg total) by mouth every 4 (four) hours as needed for severe pain.   No facility-administered  encounter medications on file as of 08/07/2022.    Allergies as of 08/07/2022 - Review Complete 08/07/2022  Allergen Reaction Noted   Phentermine Hives 05/15/2021    Past Medical History:  Diagnosis Date   Anxiety    COVID 11/07/2020   Dyspnea    SINCE COVID I/13/22 WITH ACTIVITY   GERD (gastroesophageal reflux disease)    URI (upper respiratory infection)    started zithromax 11-06-2020    Past Surgical History:  Procedure Laterality Date   DILATATION AND CURETTAGE/HYSTEROSCOPY WITH MINERVA N/A 05/23/2021   Procedure: DILATATION AND CURETTAGE/HYSTEROSCOPY WITH MINERVA;  Surgeon: Janyth Contes, MD;  Location: [facility];  Service: Gynecology;  Laterality: N/A;   LAPAROSCOPIC BILATERAL SALPINGECTOMY Bilateral 05/23/2021   Procedure: LAPAROSCOPIC BILATERAL SALPINGECTOMY;  Surgeon: Janyth Contes, MD;  Location: [facility];  Service: Gynecology;  Laterality: Bilateral;   LAPAROSCOPIC CHOLECYSTECTOMY  2010   WISDOM TOOTH EXTRACTION  2017    Family History  Problem Relation Age of Onset   Diabetes Maternal Grandmother    Colon cancer Neg Hx    Esophageal cancer Neg Hx    Rectal cancer Neg Hx    Stomach cancer Neg Hx     Social History   Socioeconomic History   Marital status: Single    Spouse name: Not on file   Number of children: 1   Years of education: Not on file   Highest education level: Not on file  Occupational History   Occupation: Ecologist: [employer]  Tobacco Use   Smoking status: Never   Smokeless tobacco: Never  Vaping Use   Vaping Use: Never used  Substance and Sexual Activity   Alcohol use: Yes    Comment: social   Drug use: Never   Sexual activity: Not on file  Other Topics Concern   Not on file  Social History Narrative   Not on file   Social Determinants of Health   Financial Resource Strain: Not on file  Food Insecurity: Not on file  Transportation Needs: Not on file   Physical Activity: Not on file  Stress: Not on file  Social Connections: Not on file  Intimate Partner Violence: Not on file    Review of Systems  Constitutional:  Negative for fatigue.  Respiratory:  Positive for cough, shortness of breath and wheezing.     Vitals:   08/07/22 0905  BP: 120/70  Pulse: 87  SpO2: 95%     Physical Exam Constitutional:      Appearance: She is obese.  HENT:     Head: Normocephalic.     Mouth/Throat:     Mouth: Mucous membranes are moist.  Cardiovascular:     Rate and Rhythm: Normal rate and regular rhythm.     Heart sounds: No murmur heard.    No friction rub.  Pulmonary:     Effort: No respiratory distress.     Breath sounds: No stridor. No wheezing or rhonchi.  Musculoskeletal:     Cervical back: No rigidity or tenderness.  Neurological:     Mental Status: She is alert.  Psychiatric:        Mood and Affect: Mood normal.     Data Reviewed: Records from primary care doctor reviewed  Assessment:  Chronic bronchitis  Airway hyperresponsiveness  Post COVID airway hyperresponsiveness  Obesity -Class III obesity    Plan/Recommendations: Prescription for azithromycin provided -Only needs to use a course of antibiotics if she feels there is an active infection ongoing  She does have low-dose of prednisone available to her -Encouraged to use this as well  Prescription for Breo -This will help with airway inflammation/hyperresponsiveness  Prescription for albuterol to be used as needed  Obtain a chest x-ray  Obtain a pulmonary function test  Encouraged to call with significant concerns   Sherrilyn Rist MD [facility] 08/07/2022, 9:35 AM  CC: Lin Landsman, MD

## 2022-08-11 ENCOUNTER — Telehealth: Payer: Self-pay | Admitting: Pulmonary Disease

## 2022-08-11 ENCOUNTER — Other Ambulatory Visit (HOSPITAL_COMMUNITY): Payer: Self-pay

## 2022-08-11 NOTE — Telephone Encounter (Signed)
For this patient it looks like there is a deductible still to be met for the insurance plan. The full co-pay of $371.24 is being contributed to the deductible. The remaining deductible after this claim is $3,052.84.   Other alternatives in the same class as Breo (ICS+LABA) per test claims are: Advair Diskus: $16.67

## 2022-08-11 NOTE — Telephone Encounter (Signed)
Good evening Dr Jenetta Downer,  I called this patient earlier and she was having issues with her Breo costing almost $400. And I sent a message to the pharmacy about other inhalers.   For this patient it looks like there is a deductible still to be met for the insurance plan. The full co-pay of $371.24 is being contributed to the deductible. The remaining deductible after this claim is $3,052.84.    Other alternatives in the same class as Breo (ICS+LABA) per test claims are: Advair Diskus: $16.67  So it looks like she has a high deductible with her inhalers. What would you like to do since she is not able to afford her medication   Please advise sir

## 2022-08-11 NOTE — Telephone Encounter (Signed)
Called and spoke to patient and she states that the [facility] inhaler is costing her almost $400. She is unsure if it needing a PA or if its just not covered.   Can we run a ticket to see what is covered for her.  Thank you

## 2022-08-12 NOTE — Telephone Encounter (Signed)
Still waiting on a response from Dr. Izora Gala and spoke with pt letting her know the info found out from pharmacy team and that we are waiting on response from Dr. Jenetta Downer. Pt verbalized understanding.

## 2022-08-13 ENCOUNTER — Other Ambulatory Visit: Payer: Self-pay | Admitting: Pulmonary Disease

## 2022-08-13 MED ORDER — FLUTICASONE-SALMETEROL 250-50 MCG/ACT IN AEPB: 1.0000 | INHALATION_SPRAY | Freq: Two times a day (BID) | RESPIRATORY_TRACT | 3 refills | Status: DC

## 2022-08-13 NOTE — Telephone Encounter (Signed)
Prescription for Advair Diskus

## 2022-08-13 NOTE — Telephone Encounter (Signed)
I called and spoke with the pt and let her know that the advair was sent  She verbalized understanding  Nothing further needed

## 2022-08-17 ENCOUNTER — Telehealth: Payer: Self-pay | Admitting: Pulmonary Disease

## 2022-08-17 DIAGNOSIS — R0602 Shortness of breath: Secondary | ICD-10-CM

## 2022-08-17 NOTE — Telephone Encounter (Signed)
Spoke with pt she has used 1 puff of Wixella which was this morning then within 10 - 15 mins pt states her chest became more "tired" feeling. Not painful but she is more aware of her breathing. Pt denies any throat closing or increased SOB or breathe. Pt states she does feel more open in her lungs. Pt is questioning if this how the medication is supposed to work or an Surveyor, quantity. Dr. Ander Slade please advise.

## 2022-08-17 NOTE — Telephone Encounter (Signed)
I called and spoke with the pt and notified of response per AO  She verbalized understanding  Nothing further needed

## 2022-08-17 NOTE — Telephone Encounter (Signed)
It may be because it is a dry powder inhaler  Some people do not tolerate it okay. Not an adverse reaction but I am not sure that she will tolerate it well.  Only other option will be a steroid HFA like Flovent but I do not know how much they cost   Only other option will be to use albuterol as needed to improve symptoms and continue to monitor symptoms

## 2022-08-21 NOTE — Addendum Note (Signed)
Addended by: Dessie Coma on: 08/21/2022 04:58 PM   Modules accepted: Orders

## 2022-09-25 ENCOUNTER — Ambulatory Visit: Payer: Commercial Managed Care - HMO | Admitting: Pulmonary Disease

## 2022-09-25 ENCOUNTER — Encounter: Payer: Self-pay | Admitting: Pulmonary Disease

## 2022-09-25 VITALS — BP 124/78 | HR 95 | Temp 98.5°F | Ht 66.0 in | Wt 276.4 lb

## 2022-09-25 DIAGNOSIS — R0602 Shortness of breath: Secondary | ICD-10-CM

## 2022-09-25 LAB — PULMONARY FUNCTION TEST
DL/VA % pred: 107 %
DL/VA: 4.75 ml/min/mmHg/L
DLCO unc % pred: 99 %
DLCO unc: 23.49 ml/min/mmHg
FEF 25-75 Post: 3.84 L/sec
FEF 25-75 Pre: 3.46 L/sec
FEF2575-%Change-Post: 10 %
FEF2575-%Pred-Post: 114 %
FEF2575-%Pred-Pre: 103 %
FEV1-%Change-Post: 6 %
FEV1-%Pred-Post: 95 %
FEV1-%Pred-Pre: 89 %
FEV1-Post: 3.12 L
FEV1-Pre: 2.92 L
FEV1FVC-%Change-Post: 4 %
FEV1FVC-%Pred-Pre: 100 %
FEV6-%Change-Post: 2 %
FEV6-%Pred-Post: 91 %
FEV6-%Pred-Pre: 89 %
FEV6-Post: 3.59 L
FEV6-Pre: 3.5 L
FEV6FVC-%Change-Post: 0 %
FEV6FVC-%Pred-Post: 101 %
FEV6FVC-%Pred-Pre: 101 %
FVC-%Change-Post: 2 %
FVC-%Pred-Post: 90 %
FVC-%Pred-Pre: 88 %
FVC-Post: 3.6 L
FVC-Pre: 3.51 L
Post FEV1/FVC ratio: 87 %
Post FEV6/FVC ratio: 100 %
Pre FEV1/FVC ratio: 83 %
Pre FEV6/FVC Ratio: 100 %

## 2022-09-25 NOTE — Patient Instructions (Signed)
Attempted Full PFT Today.Pre/Post Spirometry and DLCO Performed.

## 2022-09-25 NOTE — Progress Notes (Signed)
Attempted Full PFT Today.Pre/Post Spirometry and DLCO Performed.

## 2022-09-25 NOTE — Patient Instructions (Signed)
Continue using Wixela  N acetylcysteine/ NAC -1 twice a day, they are usually about 600 mg -You may use it for a few weeks and then stop if you do not feel it is of any benefit  Flutter device may help with secretion clearance  Call with any significant concerns  I will see you in about 4 to 5 months

## 2022-09-25 NOTE — Progress Notes (Signed)
Sabrina Cruz    [id number]    05-23-1986  Primary Care Physician:Reese, Zadie Cleverly, MD  Referring Physician: Lin Cruz, Sabrina Cruz,  [place] [address]  Chief complaint:   Cough is a little bit better but still brings up secretions  HPI:  She is currently on Gates which she is only using about once a day  Cough is less but still coughs up secretions  No chest pains or chest discomfort  Did have a PFT today showing normal PFT  Chest x-ray was within normal limits, did have some increased bronchovascular markings  No history of asthma or allergies  Did have COVID in January 2021 with symptoms lasting about a week, did have a cough and sputum production at the time Recurrence of COVID infection September 2021  Cough, sputum production  Never smoker No significant secondhand smoke exposure  Does have a pet dog has had the dog for about 8 years, not allergic to dogs as known to her  No family history of asthma  Admits to some nasal congestion  Outpatient Encounter Medications as of 09/25/2022  Medication Sig   albuterol (VENTOLIN HFA) 108 (90 Base) MCG/ACT inhaler Inhale 2 puffs into the lungs every 6 (six) hours as needed for wheezing or shortness of breath.   fexofenadine-pseudoephedrine (ALLEGRA-D 24) 180-240 MG 24 hr tablet Take 1 tablet by mouth as needed.   fluticasone-salmeterol (ADVAIR) 250-50 MCG/ACT AEPB Inhale 1 puff into the lungs in the morning and at bedtime.   ibuprofen (ADVIL) 800 MG tablet Take 1 tablet (800 mg total) by mouth every 8 (eight) hours as needed.   lansoprazole (PREVACID) 30 MG capsule Take 30 mg by mouth daily as needed.   Multiple Vitamin (MULITIVITAMIN WITH MINERALS) TABS Take 1 tablet by mouth daily.   [DISCONTINUED] azithromycin (ZITHROMAX) 250 MG tablet Take 1 tablet (250 mg total) by mouth daily. 2 tablets on day 1 and then daily for 4 days   [DISCONTINUED] oxyCODONE (ROXICODONE) 5 MG immediate release  tablet Take 1 tablet (5 mg total) by mouth every 4 (four) hours as needed for severe pain.   No facility-administered encounter medications on file as of 09/25/2022.    Allergies as of 09/25/2022 - Review Complete 09/25/2022  Allergen Reaction Noted   Phentermine Hives 05/15/2021    Past Medical History:  Diagnosis Date   Anxiety    COVID 11/07/2020   Dyspnea    SINCE COVID I/13/22 WITH ACTIVITY   GERD (gastroesophageal reflux disease)    URI (upper respiratory infection)    started zithromax 11-06-2020    Past Surgical History:  Procedure Laterality Date   DILATATION AND CURETTAGE/HYSTEROSCOPY WITH MINERVA N/A 05/23/2021   Procedure: DILATATION AND CURETTAGE/HYSTEROSCOPY WITH MINERVA;  Surgeon: Janyth Contes, MD;  Location: [facility];  Service: Gynecology;  Laterality: N/A;   LAPAROSCOPIC BILATERAL SALPINGECTOMY Bilateral 05/23/2021   Procedure: LAPAROSCOPIC BILATERAL SALPINGECTOMY;  Surgeon: Janyth Contes, MD;  Location: [facility];  Service: Gynecology;  Laterality: Bilateral;   LAPAROSCOPIC CHOLECYSTECTOMY  2010   WISDOM TOOTH EXTRACTION  2017    Family History  Problem Relation Age of Onset   Diabetes Maternal Grandmother    Colon cancer Neg Hx    Esophageal cancer Neg Hx    Rectal cancer Neg Hx    Stomach cancer Neg Hx     Social History   Socioeconomic History   Marital status: Single    Spouse name:  Not on file   Number of children: 1   Years of education: Not on file   Highest education level: Not on file  Occupational History   Occupation: Ecologist: [employer]  Tobacco Use   Smoking status: Never    Passive exposure: Never   Smokeless tobacco: Never  Vaping Use   Vaping Use: Never used  Substance and Sexual Activity   Alcohol use: Yes    Comment: social   Drug use: Never   Sexual activity: Not on file  Other Topics Concern   Not on file  Social History Narrative   Not on file    Social Determinants of Health   Financial Resource Strain: Not on file  Food Insecurity: Not on file  Transportation Needs: Not on file  Physical Activity: Not on file  Stress: Not on file  Social Connections: Not on file  Intimate Partner Violence: Not on file    Review of Systems  Constitutional:  Negative for fatigue.  Respiratory:  Positive for cough, shortness of breath and wheezing.     Vitals:   09/25/22 1013  BP: 124/78  Pulse: 95  Temp: 98.5 F (36.9 C)  SpO2: 98%     Physical Exam Constitutional:      Appearance: She is obese.  HENT:     Head: Normocephalic.     Mouth/Throat:     Mouth: Mucous membranes are moist.  Cardiovascular:     Rate and Rhythm: Normal rate and regular rhythm.     Heart sounds: No murmur heard.    No friction rub.  Pulmonary:     Effort: No respiratory distress.     Breath sounds: No stridor. No wheezing or rhonchi.  Musculoskeletal:     Cervical back: No rigidity or tenderness.  Neurological:     Mental Status: She is alert.  Psychiatric:        Mood and Affect: Mood normal.    Data Reviewed: Records from primary care doctor reviewed  Assessment:  Chronic bronchitis  Cough with sputum production  Post COVID airway hyperresponsiveness  Obesity -Class III obesity    Plan/Recommendations:  Continue Wixela  May consider N-acetylcysteine which is over-the-counter  Consider flutter device which will help clear secretions  Airway hyperresponsiveness if related to COVID should continue to improve  Follow-up in about 4 to 5 months  Sherrilyn Rist MD [facility] 09/25/2022, 10:23 AM  CC: Sabrina Landsman, MD

## 2022-09-30 ENCOUNTER — Telehealth: Payer: Self-pay | Admitting: Pulmonary Disease

## 2022-09-30 NOTE — Telephone Encounter (Signed)
Called and spoke with patient. Patient stated that she wanted to know if we had any flutter valves. Went to check to see if we ha any in yet. Told the patient that we still do not have any. Advised patient to keep calling us to see if they have came in yet.   Nothing further needed.

## 2022-11-21 ENCOUNTER — Other Ambulatory Visit: Payer: Self-pay

## 2022-11-21 ENCOUNTER — Encounter (HOSPITAL_BASED_OUTPATIENT_CLINIC_OR_DEPARTMENT_OTHER): Payer: Self-pay | Admitting: Emergency Medicine

## 2022-11-21 ENCOUNTER — Emergency Department (HOSPITAL_BASED_OUTPATIENT_CLINIC_OR_DEPARTMENT_OTHER)
Admission: EM | Admit: 2022-11-21 | Discharge: 2022-11-21 | Disposition: A | Discharge status: Home / Self Care | Payer: Commercial Managed Care - HMO | Attending: Emergency Medicine | Admitting: Emergency Medicine

## 2022-11-21 DIAGNOSIS — M542 Cervicalgia: Secondary | ICD-10-CM | POA: Insufficient documentation

## 2022-11-21 DIAGNOSIS — Y92410 Unspecified street and highway as the place of occurrence of the external cause: Secondary | ICD-10-CM | POA: Insufficient documentation

## 2022-11-21 DIAGNOSIS — M546 Pain in thoracic spine: Secondary | ICD-10-CM | POA: Diagnosis not present

## 2022-11-21 DIAGNOSIS — M545 Low back pain, unspecified: Secondary | ICD-10-CM | POA: Insufficient documentation

## 2022-11-21 MED ORDER — IBUPROFEN 800 MG PO TABS
800.0000 mg | ORAL_TABLET | Freq: Once | ORAL | Status: AC
  Administered 2022-11-21: 800 mg via ORAL
  Filled 2022-11-21: qty 1

## 2022-11-21 MED ORDER — CYCLOBENZAPRINE HCL 10 MG PO TABS: 10.0000 mg | ORAL_TABLET | Freq: Two times a day (BID) | ORAL | 0 refills | Status: AC | PRN

## 2022-11-21 MED ORDER — OXYCODONE-ACETAMINOPHEN 5-325 MG PO TABS
1.0000 | ORAL_TABLET | ORAL | Status: DC | PRN
  Administered 2022-11-21: 1 via ORAL
  Filled 2022-11-21: qty 1

## 2022-11-21 MED ORDER — IBUPROFEN 600 MG PO TABS: 600.0000 mg | ORAL_TABLET | Freq: Four times a day (QID) | ORAL | 0 refills | Status: AC | PRN
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
## 2022-11-21 NOTE — ED Notes (Signed)
Dc instructions reviewed with patient. Patient voiced understanding. Dc with belongings.  °
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
## 2024-01-10 ENCOUNTER — Encounter: Payer: Self-pay | Admitting: Pulmonary Disease

## 2024-10-18 ENCOUNTER — Ambulatory Visit: Admission: EM | Admit: 2024-10-18 | Discharge: 2024-10-18 | Disposition: A | Discharge status: Home / Self Care

## 2024-10-18 ENCOUNTER — Encounter: Payer: Self-pay | Admitting: Emergency Medicine

## 2024-10-18 DIAGNOSIS — T148XXA Other injury of unspecified body region, initial encounter: Secondary | ICD-10-CM

## 2024-10-18 MED ORDER — BACLOFEN 10 MG PO TABS: 10.0000 mg | ORAL_TABLET | Freq: Three times a day (TID) | ORAL | 0 refills | Status: AC

## 2024-10-18 MED ORDER — DICLOFENAC SODIUM 50 MG PO TBEC: 50.0000 mg | DELAYED_RELEASE_TABLET | Freq: Two times a day (BID) | ORAL | 1 refills | Status: AC

## 2024-10-18 MED ORDER — KETOROLAC TROMETHAMINE 60 MG/2ML IM SOLN
60.0000 mg | Freq: Once | INTRAMUSCULAR | Status: AC
  Administered 2024-10-18: 60 mg via INTRAMUSCULAR
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
